# Patient Record
Sex: Female | Born: 1970 | Race: Black or African American | Hispanic: No | Smoking: Former smoker
Health system: Southern US, Community
[De-identification: ages and names within clinical notes are randomized; demographics above are authoritative.]

## PROBLEM LIST (undated history)

## (undated) DIAGNOSIS — G4730 Sleep apnea, unspecified: Secondary | ICD-10-CM

## (undated) DIAGNOSIS — K760 Fatty (change of) liver, not elsewhere classified: Secondary | ICD-10-CM

## (undated) DIAGNOSIS — F419 Anxiety disorder, unspecified: Secondary | ICD-10-CM

## (undated) DIAGNOSIS — F319 Bipolar disorder, unspecified: Secondary | ICD-10-CM

## (undated) DIAGNOSIS — F329 Major depressive disorder, single episode, unspecified: Secondary | ICD-10-CM

## (undated) DIAGNOSIS — E119 Type 2 diabetes mellitus without complications: Secondary | ICD-10-CM

## (undated) DIAGNOSIS — K219 Gastro-esophageal reflux disease without esophagitis: Secondary | ICD-10-CM

## (undated) DIAGNOSIS — J45909 Unspecified asthma, uncomplicated: Secondary | ICD-10-CM

## (undated) DIAGNOSIS — I10 Essential (primary) hypertension: Secondary | ICD-10-CM

## (undated) DIAGNOSIS — M1990 Unspecified osteoarthritis, unspecified site: Secondary | ICD-10-CM

## (undated) DIAGNOSIS — H269 Unspecified cataract: Secondary | ICD-10-CM

## (undated) DIAGNOSIS — F32A Depression, unspecified: Secondary | ICD-10-CM

## (undated) HISTORY — PX: CATARACT EXTRACTION: SUR2

## (undated) HISTORY — DX: Essential (primary) hypertension: I10

## (undated) HISTORY — PX: CHOLECYSTECTOMY: SHX55

## (undated) HISTORY — PX: CARPAL TUNNEL RELEASE: SHX101

## (undated) HISTORY — PX: TUBAL LIGATION: SHX77

## (undated) HISTORY — DX: Type 2 diabetes mellitus without complications: E11.9

## (undated) HISTORY — PX: HEMORROIDECTOMY: SUR656

## (undated) HISTORY — DX: Sleep apnea, unspecified: G47.30

## (undated) HISTORY — PX: HERNIA REPAIR: SHX51

---

## 2016-01-02 ENCOUNTER — Other Ambulatory Visit (HOSPITAL_COMMUNITY): Payer: Self-pay | Admitting: General Surgery

## 2016-01-22 ENCOUNTER — Ambulatory Visit (HOSPITAL_COMMUNITY)
Admission: RE | Admit: 2016-01-22 | Discharge: 2016-01-22 | Disposition: A | Discharge status: Home / Self Care | Payer: Medicare Other | Source: Ambulatory Visit | Attending: General Surgery | Admitting: General Surgery

## 2016-01-22 ENCOUNTER — Other Ambulatory Visit: Payer: Self-pay

## 2016-01-22 ENCOUNTER — Other Ambulatory Visit (HOSPITAL_COMMUNITY): Payer: Self-pay | Admitting: General Surgery

## 2016-01-22 DIAGNOSIS — Z01818 Encounter for other preprocedural examination: Secondary | ICD-10-CM | POA: Insufficient documentation

## 2016-01-22 DIAGNOSIS — Z6841 Body Mass Index (BMI) 40.0 and over, adult: Secondary | ICD-10-CM | POA: Diagnosis not present

## 2016-01-22 DIAGNOSIS — K219 Gastro-esophageal reflux disease without esophagitis: Secondary | ICD-10-CM | POA: Diagnosis not present

## 2016-01-29 ENCOUNTER — Ambulatory Visit (INDEPENDENT_AMBULATORY_CARE_PROVIDER_SITE_OTHER): Payer: Medicare Other | Admitting: Psychiatry

## 2016-02-11 ENCOUNTER — Ambulatory Visit: Payer: Medicare Other | Admitting: Psychiatry

## 2016-02-11 ENCOUNTER — Encounter: Payer: Self-pay | Admitting: Dietician

## 2016-02-11 ENCOUNTER — Encounter: Payer: Medicare Other | Attending: General Surgery | Admitting: Dietician

## 2016-02-11 DIAGNOSIS — Z6841 Body Mass Index (BMI) 40.0 and over, adult: Secondary | ICD-10-CM | POA: Insufficient documentation

## 2016-02-11 DIAGNOSIS — E119 Type 2 diabetes mellitus without complications: Secondary | ICD-10-CM | POA: Insufficient documentation

## 2016-02-11 NOTE — Progress Notes (Signed)
  Pre-Op Assessment Visit:  Pre-Operative Sleeve Gastrectomy Surgery  Medical Nutrition Therapy:  Appt start time: 1100   End time:  1130.  Patient was seen on 02/11/2016 for Pre-Operative Nutrition Assessment. Assessment and [facility] coordinator on 02/11/2016.   Preferred Learning Style:   No preference indicated   Learning Readiness:   Ready  Handouts given during visit include:  Pre-Op Goals Bariatric Surgery Protein Shakes   During the appointment today the following Pre-Op Goals were reviewed with the patient: Maintain or lose weight as instructed by your surgeon Make healthy food choices Begin to limit portion sizes Limited concentrated sugars and fried foods Keep fat/sugar in the single digits per serving on   food labels Practice CHEWING your food  (aim for 30 chews per bite or until applesauce consistency) Practice not drinking 15 minutes before, during, and 30 minutes after each meal/snack Avoid all carbonated beverages  Avoid/limit caffeinated beverages  Avoid all sugar-sweetened beverages Consume 3 meals per day; eat every 3-5 hours Make a list of non-food related activities Aim for 64-100 ounces of FLUID daily  Aim for at least 60-80 grams of PROTEIN daily Look for a liquid protein source that contain ?15 g protein and ?5 g carbohydrate  (ex: shakes, drinks, shots)  Patient-Centered Goals: Get off CPAP, decrease pain level, be able to walk without getting out of breath, get nicer clothes, feel better  10 confidence/10 importance scale   Demonstrated degree of understanding via:  Teach Back  Teaching Method Utilized:  Visual Auditory Hands on  Barriers to learning/adherence to lifestyle change: none  Patient to call the [facility] to enroll in Pre-Op and Post-Op Nutrition Education when surgery date is scheduled.

## 2016-02-11 NOTE — Patient Instructions (Signed)
Follow Pre-Op Goals Try Protein Shakes Call [facility] at [phone] when surgery is scheduled to enroll in Pre-Op Class  Things to remember:  Please always be honest with us. We want to support you!  If you have any questions or concerns in between appointments, please call or email Liz, Leslie, or Laurie.  The diet after surgery will be high protein and low in carbohydrate.  Vitamins and calcium need to be taken for the rest of your life.  Feel free to include support people in any classes or appointments.   Supplement recommendations:  Complete" Multivitamin: Sleeve Gastrectomy and RYGB patients take a double dose of MVI. LAGB patients take single dose as it is written on the package. Vitamin must be liquid or chewable but not gummy. Examples of these include Flintstones Complete and Centrum Complete. If the vitamin is bariatric-specific, take 1 dose as it is already formulated for bariatric surgery patients. Examples of these are Bariatric Advantage, Celebrate, and Wellesse. These can be found at the [facility] and/or online.     Calcium citrate: 1500 mg/day of Calcium citrate (also chewable or liquid) is recommended for all procedures. The body is only able to absorb 500-600 mg of Calcium at one time so 3 daily doses of 500 mg are recommended. Calcium doses must be taken a minimum of 2 hours apart. Additionally, Calcium must be taken 2 hours apart from iron-containing MVI. Examples of brands include Celebrate, Bariatric Advantage, and Wellesse. These brands must be purchased online or at the [facility]. Citracal Petites is the only Calcium citrate supplement found in general grocery stores and pharmacies. This is in tablet form and may be recommended for patients who do not tolerate chewable Calcium.  Continued or added Vitamin D supplementation based on individual needs.    Vitamin B12: 300-500 mcg/day for Sleeve Gastrectomy and RYGB. Optional for  LAGB patients as stomach remains fully intact. Must be taken intramuscularly, sublingually, or inhaled nasally. Oral route is not recommended. 

## 2016-02-12 ENCOUNTER — Ambulatory Visit: Payer: Medicare Other | Admitting: Psychiatry

## 2016-02-23 ENCOUNTER — Ambulatory Visit: Payer: Self-pay

## 2016-02-23 DIAGNOSIS — K760 Fatty (change of) liver, not elsewhere classified: Secondary | ICD-10-CM

## 2016-02-23 HISTORY — DX: Fatty (change of) liver, not elsewhere classified: K76.0

## 2016-02-27 ENCOUNTER — Ambulatory Visit: Payer: Self-pay | Admitting: General Surgery

## 2016-03-01 ENCOUNTER — Encounter: Payer: Medicare Other | Attending: General Surgery

## 2016-03-01 DIAGNOSIS — Z6841 Body Mass Index (BMI) 40.0 and over, adult: Secondary | ICD-10-CM | POA: Insufficient documentation

## 2016-03-01 DIAGNOSIS — E119 Type 2 diabetes mellitus without complications: Secondary | ICD-10-CM | POA: Insufficient documentation

## 2016-03-03 NOTE — Progress Notes (Signed)
  Pre-Operative Nutrition Class:  Appt start time: 1859   End time:  1830.  Patient was seen on 03/01/16 for Pre-Operative Bariatric Surgery Education at the [facility].   Surgery date: 03/16/16 Surgery type: Sleeve gastrectomy Start weight at [facility]: 313 lbs on 02/11/2016 Weight today: 307.1 lbs  TANITA  BODY COMP RESULTS  03/01/16   BMI (kg/m^2) N/A   Fat Mass (lbs)    Fat Free Mass (lbs)    Total Body Water (lbs)    Samples given per MNT protocol. Patient educated on appropriate usage: Premier protein shake (vanilla - qty 1) Lot #: [id number] Exp: 09/2016  Celebrate Vitamins Multivitamin (grape - qty 1) Lot #: [id number] Exp: 11/2016  Bariatric Advantage Calcium Citrate chew (caramel - qty 1) Lot #: [id number] Exp: 08/2016  Renee Pain Protein Powder (chocolate - qty 1) Lot #: [id number] Exp: 08/2017  The following the learning objectives were met by the patient during this course:  Identify Pre-Op Dietary Goals and will begin 2 weeks pre-operatively  Identify appropriate sources of fluids and proteins   State protein recommendations and appropriate sources pre and post-operatively  Identify Post-Operative Dietary Goals and will follow for 2 weeks post-operatively  Identify appropriate multivitamin and calcium sources  Describe the need for physical activity post-operatively and will follow MD recommendations  State when to call healthcare provider regarding medication questions or post-operative complications  Handouts given during class include:  Pre-Op Bariatric Surgery Diet Handout  Protein Shake Handout  Post-Op Bariatric Surgery Nutrition Handout  BELT Program Information Flyer  Support Group Information Flyer  [facility] Bariatric Supplements Price List  Follow-Up Plan: Patient will follow-up at [facility] 2 weeks post operatively for diet advancement per MD.

## 2016-03-12 ENCOUNTER — Encounter (HOSPITAL_COMMUNITY): Payer: Self-pay

## 2016-03-12 ENCOUNTER — Other Ambulatory Visit (HOSPITAL_COMMUNITY): Payer: Self-pay

## 2016-03-12 ENCOUNTER — Ambulatory Visit: Payer: Self-pay | Admitting: General Surgery

## 2016-03-12 ENCOUNTER — Encounter (HOSPITAL_COMMUNITY)
Admission: RE | Admit: 2016-03-12 | Discharge: 2016-03-12 | Disposition: A | Discharge status: Home / Self Care | Payer: Medicare Other | Source: Ambulatory Visit | Attending: General Surgery | Admitting: General Surgery

## 2016-03-12 DIAGNOSIS — Z01812 Encounter for preprocedural laboratory examination: Secondary | ICD-10-CM | POA: Diagnosis present

## 2016-03-12 DIAGNOSIS — Z6841 Body Mass Index (BMI) 40.0 and over, adult: Secondary | ICD-10-CM | POA: Diagnosis not present

## 2016-03-12 HISTORY — DX: Bipolar disorder, unspecified: F31.9

## 2016-03-12 HISTORY — DX: Anxiety disorder, unspecified: F41.9

## 2016-03-12 HISTORY — DX: Unspecified osteoarthritis, unspecified site: M19.90

## 2016-03-12 HISTORY — DX: Gastro-esophageal reflux disease without esophagitis: K21.9

## 2016-03-12 HISTORY — DX: Unspecified asthma, uncomplicated: J45.909

## 2016-03-12 HISTORY — DX: Depression, unspecified: F32.A

## 2016-03-12 HISTORY — DX: Major depressive disorder, single episode, unspecified: F32.9

## 2016-03-12 LAB — COMPREHENSIVE METABOLIC PANEL
ALBUMIN: 3.8 g/dL (ref 3.5–5.0)
ALT: 64 U/L — ABNORMAL HIGH (ref 14–54)
AST: 40 U/L (ref 15–41)
Alkaline Phosphatase: 64 U/L (ref 38–126)
Anion gap: 11 (ref 5–15)
BILIRUBIN TOTAL: 0.5 mg/dL (ref 0.3–1.2)
BUN: 13 mg/dL (ref 6–20)
CO2: 24 mmol/L (ref 22–32)
Calcium: 9.2 mg/dL (ref 8.9–10.3)
Chloride: 105 mmol/L (ref 101–111)
Creatinine, Ser: 0.87 mg/dL (ref 0.44–1.00)
GFR calc Af Amer: >60 mL/min (ref >60)
GFR calc non Af Amer: >60 mL/min (ref >60)
Glucose, Bld: 107 mg/dL — ABNORMAL HIGH (ref 65–99)
POTASSIUM: 3.9 mmol/L (ref 3.5–5.1)
Sodium: 140 mmol/L (ref 135–145)
TOTAL PROTEIN: 7.6 g/dL (ref 6.5–8.1)

## 2016-03-12 LAB — CBC WITH DIFFERENTIAL/PLATELET
BASOS ABS: 0 10*3/uL (ref 0.0–0.1)
BASOS PCT: 0 %
Eosinophils Absolute: 0.2 10*3/uL (ref 0.0–0.7)
Eosinophils Relative: 3 %
HEMATOCRIT: 38 % (ref 36.0–46.0)
HEMOGLOBIN: 12.5 g/dL (ref 12.0–15.0)
Lymphocytes Relative: 35 %
Lymphs Abs: 2.5 10*3/uL (ref 0.7–4.0)
MCH: 25.1 pg — ABNORMAL LOW (ref 26.0–34.0)
MCHC: 32.9 g/dL (ref 30.0–36.0)
MCV: 76.3 fL — ABNORMAL LOW (ref 78.0–100.0)
Monocytes Absolute: 0.4 10*3/uL (ref 0.1–1.0)
Monocytes Relative: 6 %
NEUTROS ABS: 3.9 10*3/uL (ref 1.7–7.7)
NEUTROS PCT: 56 %
Platelets: 419 10*3/uL — ABNORMAL HIGH (ref 150–400)
RBC: 4.98 MIL/uL (ref 3.87–5.11)
RDW: 15.4 % (ref 11.5–15.5)
WBC: 7.1 10*3/uL (ref 4.0–10.5)

## 2016-03-12 NOTE — H&P (Signed)
Gabriela Haney 03/12/2016 3:00 PM Location: [facility] Patient #: [id number] DOB: 05/16/1971 Single / Language: English / Race: Black or African American Female  History of Present Illness ([place] M. Jabreel Chimento MD; 03/12/2016 5:51 PM) The patient is a 44 year old female who presents for a pre-op visit. She comes in today for her preoperative visit she has been scheduled and approved for gastric sleeve. Surgery is May 23. I initially met her January 4. Her weight at that time was 306 pounds with a BMI of 48. She denies any medical changes since she was initially evaluated. She denies any trips to the emergency room or hospital. She denies any chest pain, chest pressure, shortness of breath, dyspnea on exertion, she denies smoking. She does have some reflux and takes reflux medication daily. She denies any issues with solid foods or tolerating liquids.  Her upper GI showed mild reflux otherwise no hiatal hernia or esophageal dysmotility. Her chest x-ray was within normal limits. She had had complete blood work at her primary care doctor's office a few days before her initial visit. 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 Gallbladder  Surgery - Laparoscopic Cataract Surgery Left.  Diagnostic Studies History (Lupie Sawa M Redina Zeller, MD; 03/12/2016 5:52 PM) Mammogram within last year Colonoscopy never Pap Smear 1-5 years ago  Allergies (Ajmal Kathan M Velma Agnes, MD; 03/12/2016 5:52 PM) No Known Drug Allergies 10/29/2015  Medication History (Demarrio Menges M Cailin Gebel, MD; 03/12/2016 5:52 PM) Omeprazole (40MG Capsule DR, Oral) Active. Etodolac (400MG Tablet, Oral) Active. Medications Reconciled OxyCODONE HCl (5MG/5ML Solution, 5-10 Milliliter Oral every four hours, as needed, Taken starting 03/12/2016) Active. Zofran (4MG Tablet, 1 (one) Tablet Oral every six hours, Taken starting 03/12/2016) Active. Loratadine (10MG Tablet, Oral) Active. Montelukast Sodium (10MG Tablet, Oral) Active. HydroCHLOROthiazide (25MG Tablet, Oral) Active. Vitamin D (Ergocalciferol) (50000UNIT Capsule, Oral) Active. Hydrocodone-Acetaminophen (10-325MG Tablet, Oral) Active. ALPRAZolam (0.5MG Tablet, Oral) Active. MetFORMIN HCl (500MG Tablet, Oral) Active. Fish Oil (1000MG Capsule, Oral) Active. Garcinia Cambogia XT Active.  Social History (Winnie Umali M Stassi Fadely, MD; 03/12/2016 5:52 PM) Tobacco use Former smoker. Illicit drug use Remotely quit drug use. Caffeine use Carbonated beverages, Coffee, Tea. Alcohol use Occasional alcohol use.  Family History (Maccoy Haubner M Tramell Piechota, MD; 03/12/2016 5:52 PM) Alcohol Abuse Family Members In General. Hypertension Family Members In General, Father, Mother, Sister. Diabetes Mellitus Family Members In General, Mother. Breast Cancer Family Members In General. Arthritis Family Members In General, Sister, Son. Depression Family Members In General. Cervical Cancer Sister.  Pregnancy / Birth History (Lundynn Cohoon M Kalep Full, MD; 03/12/2016 5:52 PM) Age at menarche 13 years. Maternal age <15 Gravida 4 Para 2 Regular periods     Review of Systems (Zilpha Mcandrew M. Flavius Repsher MD; 03/12/2016 5:48 PM) General Present- Night Sweats and  Weight Gain. Not Present- Appetite Loss, Chills, Fatigue, Fever and Weight Loss. Skin Not Present- Change in Wart/Mole, Dryness, Hives, Jaundice,   New Lesions, Non-Healing Wounds, Rash and Ulcer. HEENT Present- Wears glasses/contact lenses. Not Present- Earache, Hearing Loss, Hoarseness, Nose Bleed, Oral Ulcers, Ringing in the Ears, Seasonal Allergies, Sinus Pain, Sore Throat, Visual Disturbances and Yellow Eyes. Respiratory Present- Snoring. Not Present- Bloody sputum, Chronic Cough, Difficulty Breathing and Wheezing. Breast Not Present- Breast Mass, Breast Pain, Nipple Discharge and Skin Changes. Cardiovascular Present- Shortness of Breath. Not Present- Chest Pain, Difficulty Breathing Lying Down, Leg Cramps, Palpitations, Rapid Heart Rate and Swelling of Extremities. Gastrointestinal Not Present- Abdominal Pain, Bloating, Bloody Stool, Change in Bowel Habits, Chronic diarrhea, Constipation, Difficulty Swallowing, Excessive gas, Gets full quickly at meals, Hemorrhoids, Indigestion, Nausea, Rectal Pain and Vomiting. Female Genitourinary Present- Pelvic Pain. Not Present- Frequency, Nocturia, Painful Urination and Urgency. Musculoskeletal Present- Back Pain, Joint Pain and Joint Stiffness. Not Present- Muscle Pain, Muscle Weakness and Swelling of Extremities. Neurological Present- Trouble walking. Not Present- Decreased Memory, Fainting, Headaches, Numbness, Seizures, Tingling, Tremor and Weakness. Psychiatric Present- Anxiety and Bipolar. Not Present- Change in Sleep Pattern, Depression, Fearful and Frequent crying. Endocrine Present- Excessive Hunger and Hot flashes. Not Present- Cold Intolerance, Hair Changes, Heat Intolerance and New Diabetes. Hematology Not Present- Easy Bruising, Excessive bleeding, Gland problems, HIV and Persistent Infections.  Vitals (Alisha Spillers CMA; 03/12/2016 3:01 PM) 03/12/2016 3:00 PM Weight: 304 lb Height: 66.75in Body Surface Area: 2.41 m Body Mass Index:  47.97 kg/m  Pulse: 119 (Regular)  BP: 138/82 (Sitting, Left Arm, Standard)      Physical Exam Randall Hiss M. Raphael Fitzpatrick MD; 03/12/2016 5:48 PM)  General Mental Status-Alert. General Appearance-Consistent with stated age. Hydration-Well hydrated. Voice-Normal. Note: morbidly obese, mainly central truncal  Head and Neck Head-normocephalic, atraumatic with no lesions or palpable masses. Trachea-midline. Thyroid Gland Characteristics - normal size and consistency.  Eye Eyeball - Bilateral-Extraocular movements intact. Sclera/Conjunctiva - Bilateral-No scleral icterus.  Chest and Lung Exam Chest and lung exam reveals -quiet, even and easy respiratory effort with no use of accessory muscles and on auscultation, normal breath sounds, no adventitious sounds and normal vocal resonance. Inspection Chest Wall - Normal. Back - normal.  Breast - Did not examine.  Cardiovascular Cardiovascular examination reveals -normal heart sounds, regular rate and rhythm with no murmurs and normal pedal pulses bilaterally.  Abdomen Inspection Inspection of the abdomen reveals - No Hernias. Skin - Scar - Note: small umbilical incision; old trocar scars. Palpation/Percussion Palpation and Percussion of the abdomen reveal - Soft, Non Tender, No Rebound tenderness, No Rigidity (guarding) and No hepatosplenomegaly. Auscultation Auscultation of the abdomen reveals - Bowel sounds normal.  Peripheral Vascular Upper Extremity Palpation - Pulses bilaterally normal.  Neurologic Neurologic evaluation reveals -alert and oriented x 3 with no impairment of recent or remote memory. Mental Status-Normal.  Neuropsychiatric The patient's mood and affect are described as -normal. Judgment and Insight-insight is appropriate concerning matters relevant to self.  Musculoskeletal Normal Exam - Left-Upper Extremity Strength Normal and Lower Extremity Strength Normal. Normal Exam -  Right-Upper Extremity Strength Normal and Lower Extremity Strength Normal. Note: bilateral knee crepitus  Lymphatic Head & Neck  General Head & Neck Lymphatics: Bilateral - Description - Normal. Axillary - Did not examine. Femoral & Inguinal - Did not examine.    Assessment & Plan Randall Hiss M. Amiyrah Lamere MD; 03/12/2016 5:52 PM)  MORBID OBESITY WITH BMI OF 45.0-49.9, ADULT (E66.01) Impression: We discussed her preoperative workup. We reviewed the findings of her upper GI and discussed her blood work done and her primary care physician's office. I believe she is still safe  for sleeve gastrectomy. She was given her postoperative pain and nausea prescriptions today. We discussed the preoperative diet. We rediscussed a typical postoperative recovery course as well as the typical issue with nausea and epigastric discomfort immediately after surgery. All of her questions were asked and answered.  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 Debra Calabretta, MD, FACS General, Bariatric, & Minimally Invasive Surgery [facility]  

## 2016-03-12 NOTE — Patient Instructions (Addendum)
Gabriela Haney  03/12/2016   Your procedure is scheduled on: 03/16/16  Report to [facility]  Entrance take [facility]  elevators to 3rd floor to  Short Stay Center at 5:30 am  Call this number if you have problems the morning of surgery [phone]   Remember: ONLY 1 PERSON MAY GO WITH YOU TO SHORT STAY TO GET  READY MORNING OF YOUR SURGERY.  Do not eat food or drink liquids :After Midnight Monday     Take these medicines the morning of surgery with A SIP OF WATER: Xanax, Claritin, Singulair, Omeprazole            DO NOT TAKE ANY DIABETIC MEDICATIONS DAY OF YOUR SURGERY                               You may not have any metal on your body including hair pins and              piercings  Do not wear jewelry, make-up, lotions, powders or perfumes, deodorant             Do not wear nail polish.  Do not shave  48 hours prior to surgery.                 Do not bring valuables to the hospital. [facility] IS NOT             RESPONSIBLE   FOR VALUABLES.  Contacts, dentures or bridgework may not be worn into surgery.  Leave suitcase in the car. After surgery it may be brought to your room.      _____________________________________________________________________             [facility] - Preparing for Surgery  Before surgery, you can play an important role.  Because skin is not sterile, your skin needs to be as free of germs as possible.  You can reduce the number of germs on you skin by washing with CHG (chlorahexidine gluconate) soap before surgery.  CHG is an antiseptic cleaner which kills germs and bonds with the skin to continue killing germs even after washing.  Please DO NOT use if you have an allergy to CHG or antibacterial soaps.  If your skin becomes reddened/irritated stop using the CHG and inform your nurse when you arrive at Short Stay.  Do not shave (including legs and underarms) for at least 48 hours prior to the first CHG shower.  You may shave  your face.  Please follow these instructions carefully:   1.  Shower with CHG Soap the night before surgery and the                                morning of Surgery.  2.  If you choose to wash your hair, wash your hair first as usual with your       normal shampoo.  3.  After you shampoo, rinse your hair and body thoroughly to remove the                      Shampoo.  4.  Use CHG as you would any other liquid soap.  You can apply chg directly       to the skin and wash  gently with scrungie or a clean washcloth.  5.  Apply the CHG Soap to your body ONLY FROM THE NECK DOWN.        Do not use on open wounds or open sores.  Avoid contact with your eyes,       ears, mouth and genitals (private parts).  Wash genitals (private parts)       with your normal soap.  6.  Wash thoroughly, paying special attention to the area where your surgery        will be performed.  7.  Thoroughly rinse your body with warm water from the neck down.  8.  DO NOT shower/wash with your normal soap after using and rinsing off       the CHG Soap.  9.  Pat yourself dry with a clean towel.            10.  Wear clean pajamas.            11.  Place clean sheets on your bed the night of your first shower and do not        sleep with pets.  Day of Surgery  Do not apply any lotions/deoderants the morning of surgery.  Please wear clean clothes to the hospital/surgery center.  [facility] - Preparing for Surgery  Before surgery, you can play an important role.  Because skin is not sterile, your skin needs to be as free of germs as possible.  You can reduce the number of germs on you skin by washing with CHG (chlorahexidine gluconate) soap before surgery.  CHG is an antiseptic cleaner which kills germs and bonds with the skin to continue killing germs even after washing.  Please DO NOT use if you have an allergy to CHG or antibacterial soaps.  If your skin becomes reddened/irritated stop using the CHG and inform your nurse when you  arrive at Short Stay.  Do not shave (including legs and underarms) for at least 48 hours prior to the first CHG shower.  You may shave your face.  Please follow these instructions carefully:   1.  Shower with CHG Soap the night before surgery and the                                morning of Surgery.  2.  If you choose to wash your hair, wash your hair first as usual with your       normal shampoo.  3.  After you shampoo, rinse your hair and body thoroughly to remove the                      Shampoo.  4.  Use CHG as you would any other liquid soap.  You can apply chg directly       to the skin and wash gently with scrungie or a clean washcloth.  5.  Apply the CHG Soap to your body ONLY FROM THE NECK DOWN.        Do not use on open wounds or open sores.  Avoid contact with your eyes,       ears, mouth and genitals (private parts).  Wash genitals (private parts)       with your normal soap.  6.  Wash thoroughly, paying special attention to the area where your surgery        will be performed.  7.  Thoroughly  rinse your body with warm water from the neck down.  8.  DO NOT shower/wash with your normal soap after using and rinsing off       the CHG Soap.  9.  Pat yourself dry with a clean towel.            10.  Wear clean pajamas.            11.  Place clean sheets on your bed the night of your first shower and do not        sleep with pets.  Day of Surgery  Do not apply any lotions/deodorants the morning of surgery.  Please wear clean clothes to the hospital/surgery center.

## 2016-03-12 NOTE — Pre-Procedure Instructions (Signed)
EKG in EPIC 

## 2016-03-12 NOTE — Pre-Procedure Instructions (Signed)
Lab results routed to Dr. Gaynelle AduEric [name].

## 2016-03-16 ENCOUNTER — Inpatient Hospital Stay (HOSPITAL_COMMUNITY): Payer: Medicare Other | Admitting: Registered Nurse

## 2016-03-16 ENCOUNTER — Encounter (HOSPITAL_COMMUNITY)
Admission: RE | Disposition: A | Discharge status: Home / Self Care | Payer: Self-pay | Source: Ambulatory Visit | Attending: General Surgery

## 2016-03-16 ENCOUNTER — Ambulatory Visit (HOSPITAL_COMMUNITY)
Admission: RE | Admit: 2016-03-16 | Discharge: 2016-03-16 | Disposition: A | Discharge status: Home / Self Care | Payer: Medicare Other | Source: Ambulatory Visit | Attending: General Surgery | Admitting: General Surgery

## 2016-03-16 ENCOUNTER — Encounter (HOSPITAL_COMMUNITY): Payer: Self-pay | Admitting: *Deleted

## 2016-03-16 DIAGNOSIS — M545 Low back pain: Secondary | ICD-10-CM | POA: Insufficient documentation

## 2016-03-16 DIAGNOSIS — F319 Bipolar disorder, unspecified: Secondary | ICD-10-CM | POA: Diagnosis not present

## 2016-03-16 DIAGNOSIS — F419 Anxiety disorder, unspecified: Secondary | ICD-10-CM | POA: Diagnosis not present

## 2016-03-16 DIAGNOSIS — E785 Hyperlipidemia, unspecified: Secondary | ICD-10-CM | POA: Diagnosis not present

## 2016-03-16 DIAGNOSIS — Z7984 Long term (current) use of oral hypoglycemic drugs: Secondary | ICD-10-CM | POA: Diagnosis not present

## 2016-03-16 DIAGNOSIS — G8929 Other chronic pain: Secondary | ICD-10-CM | POA: Insufficient documentation

## 2016-03-16 DIAGNOSIS — M170 Bilateral primary osteoarthritis of knee: Secondary | ICD-10-CM | POA: Insufficient documentation

## 2016-03-16 DIAGNOSIS — E119 Type 2 diabetes mellitus without complications: Secondary | ICD-10-CM | POA: Diagnosis not present

## 2016-03-16 DIAGNOSIS — G4733 Obstructive sleep apnea (adult) (pediatric): Secondary | ICD-10-CM | POA: Diagnosis not present

## 2016-03-16 DIAGNOSIS — Z6841 Body Mass Index (BMI) 40.0 and over, adult: Secondary | ICD-10-CM | POA: Insufficient documentation

## 2016-03-16 DIAGNOSIS — K760 Fatty (change of) liver, not elsewhere classified: Secondary | ICD-10-CM | POA: Diagnosis not present

## 2016-03-16 DIAGNOSIS — Z87891 Personal history of nicotine dependence: Secondary | ICD-10-CM | POA: Diagnosis not present

## 2016-03-16 DIAGNOSIS — K660 Peritoneal adhesions (postprocedural) (postinfection): Secondary | ICD-10-CM | POA: Insufficient documentation

## 2016-03-16 DIAGNOSIS — Z79899 Other long term (current) drug therapy: Secondary | ICD-10-CM | POA: Diagnosis not present

## 2016-03-16 DIAGNOSIS — I10 Essential (primary) hypertension: Secondary | ICD-10-CM | POA: Insufficient documentation

## 2016-03-16 HISTORY — PX: LAPAROSCOPIC GASTRIC SLEEVE RESECTION: SHX5895

## 2016-03-16 LAB — GLUCOSE, CAPILLARY
GLUCOSE-CAPILLARY: 131 mg/dL — AB (ref 65–99)
Glucose-Capillary: 165 mg/dL — ABNORMAL HIGH (ref 65–99)

## 2016-03-16 LAB — PREGNANCY, URINE: PREG TEST UR: NEGATIVE

## 2016-03-16 SURGERY — GASTRECTOMY, SLEEVE, LAPAROSCOPIC
Anesthesia: General | Site: Abdomen

## 2016-03-16 MED ORDER — BUPIVACAINE LIPOSOME 1.3 % IJ SUSP
INTRAMUSCULAR | Status: DC | PRN
  Administered 2016-03-16: 20 mL

## 2016-03-16 MED ORDER — HYDROCODONE-ACETAMINOPHEN 5-325 MG PO TABS: 1.0000 | ORAL_TABLET | Freq: Four times a day (QID) | ORAL | Status: DC | PRN

## 2016-03-16 MED ORDER — PROPOFOL 10 MG/ML IV BOLUS
INTRAVENOUS | Status: AC
  Filled 2016-03-16: qty 40

## 2016-03-16 MED ORDER — EPHEDRINE SULFATE 50 MG/ML IJ SOLN
INTRAMUSCULAR | Status: AC
  Filled 2016-03-16: qty 1

## 2016-03-16 MED ORDER — MEPERIDINE HCL 50 MG/ML IJ SOLN: 6.2500 mg | INTRAMUSCULAR | Status: DC | PRN

## 2016-03-16 MED ORDER — FENTANYL CITRATE (PF) 100 MCG/2ML IJ SOLN
INTRAMUSCULAR | Status: DC | PRN
  Administered 2016-03-16: 150 ug via INTRAVENOUS
  Administered 2016-03-16 (×2): 50 ug via INTRAVENOUS

## 2016-03-16 MED ORDER — FENTANYL CITRATE (PF) 250 MCG/5ML IJ SOLN
INTRAMUSCULAR | Status: AC
  Filled 2016-03-16: qty 5

## 2016-03-16 MED ORDER — KETOROLAC TROMETHAMINE 30 MG/ML IJ SOLN
INTRAMUSCULAR | Status: AC
  Filled 2016-03-16: qty 1

## 2016-03-16 MED ORDER — LABETALOL HCL 5 MG/ML IV SOLN
INTRAVENOUS | Status: DC | PRN
  Administered 2016-03-16 (×2): 2.5 mg via INTRAVENOUS

## 2016-03-16 MED ORDER — SUCCINYLCHOLINE CHLORIDE 20 MG/ML IJ SOLN
INTRAMUSCULAR | Status: DC | PRN
  Administered 2016-03-16: 100 mg via INTRAVENOUS

## 2016-03-16 MED ORDER — ATROPINE SULFATE 0.4 MG/ML IJ SOLN
INTRAMUSCULAR | Status: AC
  Filled 2016-03-16: qty 2

## 2016-03-16 MED ORDER — SODIUM CHLORIDE 0.9 % IJ SOLN
INTRAMUSCULAR | Status: AC
  Filled 2016-03-16: qty 50

## 2016-03-16 MED ORDER — DEXAMETHASONE SODIUM PHOSPHATE 10 MG/ML IJ SOLN
INTRAMUSCULAR | Status: AC
  Filled 2016-03-16: qty 1

## 2016-03-16 MED ORDER — PROPOFOL 10 MG/ML IV BOLUS
INTRAVENOUS | Status: DC | PRN
  Administered 2016-03-16: 170 mg via INTRAVENOUS

## 2016-03-16 MED ORDER — SODIUM CHLORIDE 0.9 % IJ SOLN
INTRAMUSCULAR | Status: AC
  Filled 2016-03-16: qty 10

## 2016-03-16 MED ORDER — ONDANSETRON HCL 4 MG/2ML IJ SOLN
INTRAMUSCULAR | Status: AC
  Filled 2016-03-16: qty 2

## 2016-03-16 MED ORDER — HYDROMORPHONE HCL 1 MG/ML IJ SOLN
INTRAMUSCULAR | Status: DC | PRN
  Administered 2016-03-16: 0.5 mg via INTRAVENOUS

## 2016-03-16 MED ORDER — ROCURONIUM BROMIDE 50 MG/5ML IV SOLN
INTRAVENOUS | Status: AC
  Filled 2016-03-16: qty 1

## 2016-03-16 MED ORDER — HYDROMORPHONE HCL 1 MG/ML IJ SOLN: 0.2500 mg | INTRAMUSCULAR | Status: DC | PRN

## 2016-03-16 MED ORDER — LIDOCAINE HCL (CARDIAC) 20 MG/ML IV SOLN
INTRAVENOUS | Status: AC
  Filled 2016-03-16: qty 5

## 2016-03-16 MED ORDER — LABETALOL HCL 5 MG/ML IV SOLN
INTRAVENOUS | Status: AC
  Filled 2016-03-16: qty 4

## 2016-03-16 MED ORDER — KETOROLAC TROMETHAMINE 30 MG/ML IJ SOLN
INTRAMUSCULAR | Status: DC | PRN
  Administered 2016-03-16: 30 mg via INTRAVENOUS

## 2016-03-16 MED ORDER — SODIUM CHLORIDE 0.9 % IJ SOLN
INTRAMUSCULAR | Status: DC | PRN
  Administered 2016-03-16: 50 mL via INTRAVENOUS

## 2016-03-16 MED ORDER — LACTATED RINGERS IR SOLN
Status: DC | PRN
  Administered 2016-03-16: 1000 mL

## 2016-03-16 MED ORDER — HEPARIN SODIUM (PORCINE) 5000 UNIT/ML IJ SOLN
5000.0000 [IU] | INTRAMUSCULAR | Status: AC
  Administered 2016-03-16: 5000 [IU] via SUBCUTANEOUS
  Filled 2016-03-16: qty 1

## 2016-03-16 MED ORDER — BUPIVACAINE LIPOSOME 1.3 % IJ SUSP
20.0000 mL | Freq: Once | INTRAMUSCULAR | Status: DC
  Filled 2016-03-16: qty 20

## 2016-03-16 MED ORDER — MIDAZOLAM HCL 2 MG/2ML IJ SOLN
INTRAMUSCULAR | Status: AC
  Filled 2016-03-16: qty 2

## 2016-03-16 MED ORDER — ROCURONIUM BROMIDE 100 MG/10ML IV SOLN
INTRAVENOUS | Status: DC | PRN
  Administered 2016-03-16: 30 mg via INTRAVENOUS
  Administered 2016-03-16: 20 mg via INTRAVENOUS

## 2016-03-16 MED ORDER — DEXAMETHASONE SODIUM PHOSPHATE 10 MG/ML IJ SOLN
INTRAMUSCULAR | Status: DC | PRN
  Administered 2016-03-16: 10 mg via INTRAVENOUS

## 2016-03-16 MED ORDER — SUGAMMADEX SODIUM 500 MG/5ML IV SOLN
INTRAVENOUS | Status: DC | PRN
  Administered 2016-03-16: 300 mg via INTRAVENOUS

## 2016-03-16 MED ORDER — ONDANSETRON HCL 4 MG/2ML IJ SOLN
INTRAMUSCULAR | Status: DC | PRN
  Administered 2016-03-16: 4 mg via INTRAVENOUS

## 2016-03-16 MED ORDER — SUGAMMADEX SODIUM 500 MG/5ML IV SOLN
INTRAVENOUS | Status: AC
  Filled 2016-03-16: qty 5

## 2016-03-16 MED ORDER — MIDAZOLAM HCL 5 MG/5ML IJ SOLN
INTRAMUSCULAR | Status: DC | PRN
  Administered 2016-03-16 (×2): 1 mg via INTRAVENOUS

## 2016-03-16 MED ORDER — ONDANSETRON HCL 4 MG/2ML IJ SOLN: 4.0000 mg | Freq: Once | INTRAMUSCULAR | Status: DC | PRN

## 2016-03-16 MED ORDER — HYDROMORPHONE HCL 2 MG/ML IJ SOLN
INTRAMUSCULAR | Status: AC
  Filled 2016-03-16: qty 1

## 2016-03-16 MED ORDER — CEFOTETAN DISODIUM-DEXTROSE 2-2.08 GM-% IV SOLR
2.0000 g | INTRAVENOUS | Status: AC
  Administered 2016-03-16: 2 g via INTRAVENOUS

## 2016-03-16 MED ORDER — LACTATED RINGERS IV SOLN
INTRAVENOUS | Status: DC | PRN
  Administered 2016-03-16: 07:00:00 via INTRAVENOUS

## 2016-03-16 MED ORDER — CEFOTETAN DISODIUM-DEXTROSE 2-2.08 GM-% IV SOLR
INTRAVENOUS | Status: AC
  Filled 2016-03-16: qty 50

## 2016-03-16 SURGICAL SUPPLY — 61 items
APPLICATOR COTTON TIP 6IN STRL (MISCELLANEOUS) IMPLANT
APPLIER CLIP ROT 10 11.4 M/L (STAPLE)
BLADE SURG SZ11 CARB STEEL (BLADE) ×3 IMPLANT
CABLE HIGH FREQUENCY MONO STRZ (ELECTRODE) ×3 IMPLANT
CHLORAPREP W/TINT 26ML (MISCELLANEOUS) ×6 IMPLANT
CLIP APPLIE ROT 10 11.4 M/L (STAPLE) IMPLANT
COVER SURGICAL LIGHT HANDLE (MISCELLANEOUS) IMPLANT
DERMABOND ADVANCED (GAUZE/BANDAGES/DRESSINGS) ×2
DERMABOND ADVANCED .7 DNX12 (GAUZE/BANDAGES/DRESSINGS) ×1 IMPLANT
DEVICE SUT QUICK LOAD TK 5 (STAPLE) IMPLANT
DEVICE SUT TI-KNOT TK 5X26 (MISCELLANEOUS) IMPLANT
DEVICE SUTURE ENDOST 10MM (ENDOMECHANICALS) IMPLANT
DEVICE TI KNOT TK5 (MISCELLANEOUS)
DEVICE TROCAR PUNCTURE CLOSURE (ENDOMECHANICALS) IMPLANT
DRAPE UTILITY XL STRL (DRAPES) ×6 IMPLANT
ELECT REM PT RETURN 9FT ADLT (ELECTROSURGICAL) ×3
ELECTRODE REM PT RTRN 9FT ADLT (ELECTROSURGICAL) ×1 IMPLANT
GAUZE SPONGE 4X4 12PLY STRL (GAUZE/BANDAGES/DRESSINGS) IMPLANT
GLOVE BIO SURGEON STRL SZ7.5 (GLOVE) ×3 IMPLANT
GLOVE INDICATOR 8.0 STRL GRN (GLOVE) ×3 IMPLANT
GOWN STRL REUS W/TWL XL LVL3 (GOWN DISPOSABLE) ×15 IMPLANT
HOVERMATT SINGLE USE (MISCELLANEOUS) ×3 IMPLANT
KIT BASIN OR (CUSTOM PROCEDURE TRAY) ×3 IMPLANT
MARKER SKIN DUAL TIP RULER LAB (MISCELLANEOUS) ×3 IMPLANT
NEEDLE SPNL 22GX3.5 QUINCKE BK (NEEDLE) ×3 IMPLANT
PACK UNIVERSAL I (CUSTOM PROCEDURE TRAY) ×3 IMPLANT
QUICK LOAD TK 5 (STAPLE)
RELOAD STAPLER BLUE 60MM (STAPLE) IMPLANT
RELOAD STAPLER GOLD 60MM (STAPLE) IMPLANT
RELOAD STAPLER GREEN 60MM (STAPLE) IMPLANT
SCISSORS LAP 5X45 EPIX DISP (ENDOMECHANICALS) ×3 IMPLANT
SEALANT SURGICAL APPL DUAL CAN (MISCELLANEOUS) IMPLANT
SET IRRIG TUBING LAPAROSCOPIC (IRRIGATION / IRRIGATOR) ×3 IMPLANT
SHEARS HARMONIC ACE PLUS 45CM (MISCELLANEOUS) ×3 IMPLANT
SLEEVE ADV FIXATION 5X100MM (TROCAR) ×6 IMPLANT
SLEEVE GASTRECTOMY 40FR VISIGI (MISCELLANEOUS) ×3 IMPLANT
SLEEVE XCEL OPT CAN 5 100 (ENDOMECHANICALS) IMPLANT
SOLUTION ANTI FOG 6CC (MISCELLANEOUS) ×3 IMPLANT
SPONGE LAP 18X18 X RAY DECT (DISPOSABLE) ×3 IMPLANT
STAPLER ECHELON BIOABSB 60 FLE (MISCELLANEOUS) ×6 IMPLANT
STAPLER ECHELON LONG 60 440 (INSTRUMENTS) ×3 IMPLANT
STAPLER RELOAD BLUE 60MM (STAPLE)
STAPLER RELOAD GOLD 60MM (STAPLE)
STAPLER RELOAD GREEN 60MM (STAPLE)
SUT MNCRL AB 4-0 PS2 18 (SUTURE) ×3 IMPLANT
SUT SURGIDAC NAB ES-9 0 48 120 (SUTURE) IMPLANT
SUT VICRYL 0 TIES 12 18 (SUTURE) ×3 IMPLANT
SYR 10ML ECCENTRIC (SYRINGE) ×3 IMPLANT
SYR 20CC LL (SYRINGE) ×3 IMPLANT
SYR 50ML LL SCALE MARK (SYRINGE) ×3 IMPLANT
TOWEL OR 17X26 10 PK STRL BLUE (TOWEL DISPOSABLE) ×3 IMPLANT
TOWEL OR NON WOVEN STRL DISP B (DISPOSABLE) ×3 IMPLANT
TRAY FOLEY W/METER SILVER 14FR (SET/KITS/TRAYS/PACK) IMPLANT
TRAY FOLEY W/METER SILVER 16FR (SET/KITS/TRAYS/PACK) IMPLANT
TROCAR ADV FIXATION 5X100MM (TROCAR) ×3 IMPLANT
TROCAR BLADELESS 15MM (ENDOMECHANICALS) ×3 IMPLANT
TROCAR BLADELESS OPT 5 100 (ENDOMECHANICALS) ×3 IMPLANT
TUBING CONNECTING 10 (TUBING) ×2 IMPLANT
TUBING CONNECTING 10' (TUBING) ×1
TUBING ENDO SMARTCAP (MISCELLANEOUS) ×3 IMPLANT
TUBING INSUF HEATED (TUBING) ×3 IMPLANT

## 2016-03-16 NOTE — Op Note (Signed)
03/16/2016  8:51 AM  PATIENT:  Gabriela Haney  45 y.o. female  PRE-OPERATIVE DIAGNOSIS:  Morbid Obesity  POST-OPERATIVE DIAGNOSIS:  Morbid Obesity, intra-abdominal adhesions, fatty liver  PROCEDURE:  Procedure(s): DIAGNOSTIC LAPAROSCOPY,  LYSIS OF ADHESIONS 30 min  SURGEON:  Surgeon(s): Gaynelle Adu, MD Luretha Murphy, MD  ASSISTANTS: see above   ANESTHESIA:   general  DRAINS: none   LOCAL MEDICATIONS USED:  OTHER exparel  SPECIMEN:  No Specimen  DISPOSITION OF SPECIMEN:  N/A  COUNTS:  YES  INDICATION FOR PROCEDURE: Patient is a pleasant 45 year old morbidly obese African-American female who presented today for laparoscopic sleeve gastrectomy. She had a prior umbilical hernia repair. We have discussed at length the risk and benefits of surgery. Please see H&P for further details regarding that discussion  PROCEDURE: After obtaining informed consent and receiving 5000 units of subcutaneous heparin the patient was brought to the operating room at [facility] and placed supine on the operating room table. General endotracheal anesthesia was established. Sequential compression devices were placed. Her abdomen was prepped and draped in the usual standard surgical fashion with ChloraPrep. She received IV antibiotics prior to skin incision. A surgical timeout was performed. I elected to gain access to the abdomen using the Optiview technique in the lateral left upper quadrant below the subcostal margin. A small half centimeter incision was made then using a 0 5 mm laparoscope through a 5 mm trocar I advanced it under direct visualization into the abdominal cavity. Pneumoperitoneum was smoothly established up to a patient pressure of 15 mmHg without any change in patient vital signs. There are omental adhesions to her mid abdomen mainly starting at the level of umbilicus going all the way up to her liver. Her liver appeared quite fatty. An additional 5 mm trocar was placed in the lateral left  mid abdominal wall under direct visualization. Then using the harmonic scalpel I took down the omental adhesions. There is no evidence of bowel adhered to the abdominal wall. Her mesh was intact. There is evidence of metal tacks as well. The mesh extended all the way up past her falciform ligament. All of the omental attachments were taken down. There is no evidence of bowel nearby. I then made a small incision in the epigastrium and attempted to place the liver retractor. Her left lobe was quite bulky and very hypertrophied medially. There was not a lot of working room. Her stomach and liver were above her rib cage. Before lifting up the liver her stomach was not able be visualized due to the extreme size of the liver. Upon trying to lift up the left lobe of the liver with the [facility] liver retractor, this revealed a very small working area. We could barely identify her GE junction. We tried a smaller Hotel manager and again the optics were not any better. After discussion with my Designer, television/film set we both felt it was not safe to proceed with surgery today. Her liver was simply too large and fatty. The [facility] liver retractor was removed under direct visualization. Exparel was infiltrated around the trocar sites. There is no sign of ongoing bleeding from the omentum which had been lysed. Pneumoperitoneum was released and the trochars removed. Skin incisions were closed with a 4-0 Monocryl in a subcuticular fashion followed by Dermabond. The patient was extubated and brought to the recovery room in stable condition. All needle, instrument, and sponge counts were correct 2.  The patient will be placed on our preoperative diet plan again  will have to lose 20-30 pounds prior to coming back to the operating room for another attempt for sleeve gastrectomy  PLAN OF CARE: Discharge to home after PACU  PATIENT DISPOSITION:  PACU - hemodynamically stable.   Delay start of Pharmacological VTE agent  (>24hrs) due to surgical blood loss or risk of bleeding:  not applicable  Mary SellaEric M. Andrey CampanileWilson, MD, FACS General, Bariatric, & Minimally Invasive Surgery [facility], [place]

## 2016-03-16 NOTE — Anesthesia Procedure Notes (Addendum)
Procedure Name: Intubation Date/Time: 03/16/2016 7:23 AM Performed by: Jarvis NewcomerARMISTEAD, Maximo Spratling A Pre-anesthesia Checklist: Patient identified Patient Re-evaluated:Patient Re-evaluated prior to inductionOxygen Delivery Method: Simple face mask Preoxygenation: Pre-oxygenation with 100% oxygen Intubation Type: IV induction Ventilation: Mask ventilation without difficulty Laryngoscope Size: Mac and 4 Grade View: Grade II Tube type: Oral Tube size: 7.5 mm Number of attempts: 1 Airway Equipment and Method: Stylet Placement Confirmation: ETT inserted through vocal cords under direct vision,  positive ETCO2 and breath sounds checked- equal and bilateral Secured at: 21 cm Tube secured with: Tape Comments: Intubation by B. Comer, SRNA

## 2016-03-16 NOTE — Transfer of Care (Signed)
Immediate Anesthesia Transfer of Care Note  Patient: Gabriela LanesKisha D Futch  Procedure(s) Performed: Procedure(s): DIAGNOSTIC LAPAROSCOPY LYSIS OF ADHESIONS (N/A)  Patient Location: PACU  Anesthesia Type:General  Level of Consciousness: awake, alert , oriented and patient cooperative  Airway & Oxygen Therapy: Patient Spontanous Breathing and Patient connected to face mask oxygen  Post-op Assessment: Report given to RN, Post -op Vital signs reviewed and stable and Patient moving all extremities  Post vital signs: Reviewed and stable  Last Vitals:  Filed Vitals:   03/16/16 0610  BP: 148/82  Pulse: 110  Temp: 36.7 C  Resp: 18    Last Pain: There were no vitals filed for this visit.       Complications: No apparent anesthesia complications

## 2016-03-16 NOTE — Discharge Instructions (Signed)
[facility]  ONLY GET THE [facility] PAIN PRESCRIPTION FILLED IF YOU RUN OUT OF YOUR HOME SUPPLY YOU ALREADY HAVE  LAPAROSCOPIC SURGERY: POST OP INSTRUCTIONS Always review your discharge instruction sheet given to you by the facility where your surgery was performed. IF YOU HAVE DISABILITY OR FAMILY LEAVE FORMS, YOU MUST BRING THEM TO THE OFFICE FOR PROCESSING.   DO NOT GIVE THEM TO YOUR DOCTOR.  1. A prescription for pain medication may be given to you upon discharge.  Take your pain medication as prescribed, if needed.  If narcotic pain medicine is not needed, then you may take acetaminophen (Tylenol) or ibuprofen (Advil) as needed. 2. Take your usually prescribed medications unless otherwise directed. 3. If you need a refill on your pain medication, please contact your pharmacy.  They will contact our office to request authorization. Prescriptions will not be filled after 5pm or on week-ends. 4. You should follow a light diet the first few days after arrival home, such as soup and crackers, etc.  Be sure to include lots of fluids daily. 5. Most patients will experience some swelling and bruising in the area of the incisions.  Ice packs will help.  Swelling and bruising can take several days to resolve.  6. It is common to experience some constipation if taking pain medication after surgery.  Increasing fluid intake and taking a stool softener (such as Colace) will usually help or prevent this problem from occurring.  A mild laxative (Milk of Magnesia or Miralax) should be taken according to package instructions if there are no bowel movements after 48 hours. 7.  If your surgeon used skin glue on the incision, you may shower in 24 hours.  The glue will flake off over the next 2-3 weeks.  Any sutures or staples will be removed at the office during your follow-up visit. 8. ACTIVITIES:  You may resume regular (light) daily activities beginning the next day--such as daily self-care,  walking, climbing stairs--gradually increasing activities as tolerated.  You may have sexual intercourse when it is comfortable.  Refrain from any heavy lifting or straining until approved by your doctor. a. You may drive when you are no longer taking prescription pain medication, you can comfortably wear a seatbelt, and you can safely maneuver your car and apply brakes. 9. You should see your doctor in the office for a follow-up appointment approximately 2-3 weeks after your surgery.  Make sure that you call for this appointment within a day or two after you arrive home to insure a convenient appointment time. 10. OTHER INSTRUCTIONS: LAURIE WILL CALL YOU ABOUT GETTING STARTED ON THE HIGH PROTEIN/LOW CARB DIET TO HELP SHRINK YOUR LIVER  WHEN TO CALL YOUR DOCTOR: 1. Fever over 101.0 2. Inability to urinate 3. Continued bleeding from incision. 4. Increased pain, redness, or drainage from the incision. 5. Increasing abdominal pain  The clinic staff is available to answer your questions during regular business hours.  Please dont hesitate to call and ask to speak to one of the nurses for clinical concerns.  If you have a medical emergency, go to the nearest emergency room or call 911.  A surgeon from [facility] is always on call at the hospital. [address], [address], [place], [place]  [address] ? [address] [address], [place], [place]   [address] [phone] ? [phone] ? FAX [phone] Web site: [email or web address]

## 2016-03-16 NOTE — Anesthesia Postprocedure Evaluation (Signed)
Anesthesia Post Note  Patient: Gabriela Haney  Procedure(s) Performed: Procedure(s) (LRB): DIAGNOSTIC LAPAROSCOPY LYSIS OF ADHESIONS (N/A)  Patient location during evaluation: PACU Anesthesia Type: General Level of consciousness: awake and alert Pain management: pain level controlled Vital Signs Assessment: post-procedure vital signs reviewed and stable Respiratory status: spontaneous breathing, nonlabored ventilation, respiratory function stable and patient connected to nasal cannula oxygen Cardiovascular status: blood pressure returned to baseline and stable Postop Assessment: no signs of nausea or vomiting Anesthetic complications: no    Last Vitals:  Filed Vitals:   03/16/16 0930 03/16/16 1021  BP: 128/70 145/79  Pulse: 103 104  Temp: 36.9 C   Resp: 18 18    Last Pain: There were no vitals filed for this visit.               Jalena Vanderlinden DAVID

## 2016-03-16 NOTE — Interval H&P Note (Signed)
History and Physical Interval Note:  03/16/2016 7:11 AM  Gabriela Haney  has presented today for surgery, with the diagnosis of Morbid Obesity  The various methods of treatment have been discussed with the patient and family. After consideration of risks, benefits and other options for treatment, the patient has consented to  Procedure(s): LAPAROSCOPIC GASTRIC SLEEVE RESECTION, UPPER ENDO (N/A) as a surgical intervention .  The patient's history has been reviewed, patient examined, no change in status, stable for surgery.  I have reviewed the patient's chart and labs.  Questions were answered to the patient's satisfaction.   Mary SellaEric Haney. Andrey CampanileWilson, MD, FACS General, Bariatric, & Minimally Invasive Surgery [facility], [place]   [facility]ILSON,Gabriela Haney

## 2016-03-16 NOTE — H&P (View-Only) (Signed)
Gabriela Haney 03/12/2016 3:00 PM Location: [facility] Patient #: [id number] DOB: 03-04-1971 Single / Language: Cleophus Molt / Race: Black or African American Female  History of Present Illness Randall Hiss M. [name] MD; 03/12/2016 5:51 PM) The patient is a 45 year old female who presents for a pre-op visit. She comes in today for her preoperative visit she has been scheduled and approved for gastric sleeve. Surgery is May 23. I initially met her January 4. Her weight at that time was 306 pounds with a BMI of 48. She denies any medical changes since she was initially evaluated. She denies any trips to the emergency room or hospital. She denies any chest pain, chest pressure, shortness of breath, dyspnea on exertion, she denies smoking. She does have some reflux and takes reflux medication daily. She denies any issues with solid foods or tolerating liquids.  Her upper GI showed mild reflux otherwise no hiatal hernia or esophageal dysmotility. Her chest x-ray was within normal limits. She had had complete blood work at her primary care doctor's office a few days before her initial visit. 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 Gallbladder  Surgery - Laparoscopic Cataract Surgery Left.  Diagnostic Studies History Gayland Curry, MD; 03/12/2016 5:52 PM) Mammogram within last year Colonoscopy never Pap Smear 1-5 years ago  Allergies Gayland Curry, MD; 03/12/2016 5:52 PM) No Known Drug Allergies 10/29/2015  Medication History Gayland Curry, MD; 03/12/2016 5:52 PM) Omeprazole (40MG Capsule DR, Oral) Active. Etodolac (400MG Tablet, Oral) Active. Medications Reconciled OxyCODONE HCl (5MG/5ML Solution, 5-10 Milliliter Oral every four hours, as needed, Taken starting 03/12/2016) Active. Zofran (4MG Tablet, 1 (one) Tablet Oral every six hours, Taken starting 03/12/2016) Active. Loratadine (10MG Tablet, Oral) Active. Montelukast Sodium (10MG Tablet, Oral) Active. HydroCHLOROthiazide (25MG Tablet, Oral) Active. Vitamin D (Ergocalciferol) (50000UNIT Capsule, Oral) Active. Hydrocodone-Acetaminophen (10-325MG Tablet, Oral) Active. ALPRAZolam (0.5MG Tablet, Oral) Active. MetFORMIN HCl (500MG Tablet, Oral) Active. Fish Oil (1000MG Capsule, Oral) Active. Garcinia Cambogia XT Active.  Social History Gayland Curry, MD; 03/12/2016 5:52 PM) Tobacco use Former smoker. Illicit drug use Remotely quit drug use. Caffeine use Carbonated beverages, Coffee, Tea. Alcohol use Occasional alcohol use.  Family History Gayland Curry, MD; 03/12/2016 5:52 PM) Alcohol Abuse Family Members In General. Hypertension Family Members In General, Father, Mother, Sister. Diabetes Mellitus Family Members In General, Mother. Breast Cancer Family Members In General. Arthritis Family Members In General, Sister, Son. Depression Family Members In General. Cervical Cancer Sister.  Pregnancy / Birth History Gayland Curry, MD; 03/12/2016 5:52 PM) Age at menarche 2 years. Maternal age <15 Gravida 24 Para 2 Regular periods     Review of Systems Randall Hiss M. [name] MD; 03/12/2016 5:48 PM) General Present- Night Sweats and  Weight Gain. Not Present- Appetite Loss, Chills, Fatigue, Fever and Weight Loss. Skin Not Present- Change in Wart/Mole, Dryness, Hives, Jaundice,  New Lesions, Non-Healing Wounds, Rash and Ulcer. HEENT Present- Wears glasses/contact lenses. Not Present- Earache, Hearing Loss, Hoarseness, Nose Bleed, Oral Ulcers, Ringing in the Ears, Seasonal Allergies, Sinus Pain, Sore Throat, Visual Disturbances and Yellow Eyes. Respiratory Present- Snoring. Not Present- Bloody sputum, Chronic Cough, Difficulty Breathing and Wheezing. Breast Not Present- Breast Mass, Breast Pain, Nipple Discharge and Skin Changes. Cardiovascular Present- Shortness of Breath. Not Present- Chest Pain, Difficulty Breathing Lying Down, Leg Cramps, Palpitations, Rapid Heart Rate and Swelling of Extremities. Gastrointestinal Not Present- Abdominal Pain, Bloating, Bloody Stool, Change in Bowel Habits, Chronic diarrhea, Constipation, Difficulty Swallowing, Excessive gas, Gets full quickly at meals, Hemorrhoids, Indigestion, Nausea, Rectal Pain and Vomiting. Female Genitourinary Present- Pelvic Pain. Not Present- Frequency, Nocturia, Painful Urination and Urgency. Musculoskeletal Present- Back Pain, Joint Pain and Joint Stiffness. Not Present- Muscle Pain, Muscle Weakness and Swelling of Extremities. Neurological Present- Trouble walking. Not Present- Decreased Memory, Fainting, Headaches, Numbness, Seizures, Tingling, Tremor and Weakness. Psychiatric Present- Anxiety and Bipolar. Not Present- Change in Sleep Pattern, Depression, Fearful and Frequent crying. Endocrine Present- Excessive Hunger and Hot flashes. Not Present- Cold Intolerance, Hair Changes, Heat Intolerance and New Diabetes. Hematology Not Present- Easy Bruising, Excessive bleeding, Gland problems, HIV and Persistent Infections.  Vitals (Alisha Spillers CMA; 03/12/2016 3:01 PM) 03/12/2016 3:00 PM Weight: 304 lb Height: 66.75in Body Surface Area: 2.41 m Body Mass Index:  47.97 kg/m  Pulse: 119 (Regular)  BP: 138/82 (Sitting, Left Arm, Standard)      Physical Exam Randall Hiss M. Marin Wisner MD; 03/12/2016 5:48 PM)  General Mental Status-Alert. General Appearance-Consistent with stated age. Hydration-Well hydrated. Voice-Normal. Note: morbidly obese, mainly central truncal  Head and Neck Head-normocephalic, atraumatic with no lesions or palpable masses. Trachea-midline. Thyroid Gland Characteristics - normal size and consistency.  Eye Eyeball - Bilateral-Extraocular movements intact. Sclera/Conjunctiva - Bilateral-No scleral icterus.  Chest and Lung Exam Chest and lung exam reveals -quiet, even and easy respiratory effort with no use of accessory muscles and on auscultation, normal breath sounds, no adventitious sounds and normal vocal resonance. Inspection Chest Wall - Normal. Back - normal.  Breast - Did not examine.  Cardiovascular Cardiovascular examination reveals -normal heart sounds, regular rate and rhythm with no murmurs and normal pedal pulses bilaterally.  Abdomen Inspection Inspection of the abdomen reveals - No Hernias. Skin - Scar - Note: small umbilical incision; old trocar scars. Palpation/Percussion Palpation and Percussion of the abdomen reveal - Soft, Non Tender, No Rebound tenderness, No Rigidity (guarding) and No hepatosplenomegaly. Auscultation Auscultation of the abdomen reveals - Bowel sounds normal.  Peripheral Vascular Upper Extremity Palpation - Pulses bilaterally normal.  Neurologic Neurologic evaluation reveals -alert and oriented x 3 with no impairment of recent or remote memory. Mental Status-Normal.  Neuropsychiatric The patient's mood and affect are described as -normal. Judgment and Insight-insight is appropriate concerning matters relevant to self.  Musculoskeletal Normal Exam - Left-Upper Extremity Strength Normal and Lower Extremity Strength Normal. Normal Exam -  Right-Upper Extremity Strength Normal and Lower Extremity Strength Normal. Note: bilateral knee crepitus  Lymphatic Head & Neck  General Head & Neck Lymphatics: Bilateral - Description - Normal. Axillary - Did not examine. Femoral & Inguinal - Did not examine.    Assessment & Plan Randall Hiss M. Lavoris Canizales MD; 03/12/2016 5:52 PM)  MORBID OBESITY WITH BMI OF 45.0-49.9, ADULT (E66.01) Impression: We discussed her preoperative workup. We reviewed the findings of her upper GI and discussed her blood work done and her primary care physician's office. I believe she is still safe  for sleeve gastrectomy. She was given her postoperative pain and nausea prescriptions today. We discussed the preoperative diet. We rediscussed a typical postoperative recovery course as well as the typical issue with nausea and epigastric discomfort immediately after surgery. All of her questions were asked and answered.  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 Redmond Pulling, MD, FACS General, Bariatric, & Minimally Invasive Surgery [facility], [place]

## 2016-03-16 NOTE — Anesthesia Preprocedure Evaluation (Addendum)
Anesthesia Evaluation  Patient identified by MRN, date of birth, ID band Patient awake    Reviewed: Allergy & Precautions, NPO status , Patient's Chart, lab work & pertinent test results  Airway Mallampati: II  TM Distance: >3 FB Neck ROM: Full    Dental   Pulmonary sleep apnea , former smoker,    Pulmonary exam normal        Cardiovascular hypertension, Pt. on medications Normal cardiovascular exam     Neuro/Psych Anxiety Depression Bipolar Disorder    GI/Hepatic   Endo/Other  diabetes, Type 2, Oral Hypoglycemic Agents  Renal/GU      Musculoskeletal   Abdominal   Peds  Hematology   Anesthesia Other Findings   Reproductive/Obstetrics                            Anesthesia Physical Anesthesia Plan  ASA: III  Anesthesia Plan: General   Post-op Pain Management:    Induction: Intravenous  Airway Management Planned: Oral ETT  Additional Equipment:   Intra-op Plan:   Post-operative Plan: Extubation in OR  Informed Consent: I have reviewed the patients History and Physical, chart, labs and discussed the procedure including the risks, benefits and alternatives for the proposed anesthesia with the patient or authorized representative who has indicated his/her understanding and acceptance.     Plan Discussed with: CRNA and Surgeon  Anesthesia Plan Comments:         Anesthesia Quick Evaluation

## 2016-03-30 ENCOUNTER — Ambulatory Visit: Payer: Self-pay

## 2016-04-06 ENCOUNTER — Ambulatory Visit: Payer: Self-pay | Admitting: General Surgery

## 2016-04-06 NOTE — H&P (Signed)
Gabriela Haney 04/06/2016 3:45 PM Location: [facility] Patient #: [id number] DOB: 05-Dec-1970 Single / Language: [place] / Race: Black or African American Female  History of Present Illness Gabriela Areola M. [name] MD; 04/06/2016 4:10 PM) The patient is a 45 year old female who presents for a pre-op visit. She comes in today for her postoperative visit after diagnostic laparoscopy. Her weight loss surgery had to be aborted due to the size of her liver. She had a very fatty liver which completely obscured the stomach making surgery unsafe. She comes in today to make sure she is healing well from surgery as well as talk about additional weight loss prior to reattachment. She has lost about 15 pounds since her preoperative visit. She did have an episode of muscle weakness, nausea, vomiting, cramps. This prompted her to go to the emergency room last week. She states that she had an exhaustive workup and was found to be hypokalemic. She has had no additional symptoms then. She states that she had been taking her potassium supplement. She denies any chest pain, chest pressure, shortness of breath, dyspnea on exertion, she denies smoking. She does have some reflux and takes reflux medication daily. She denies any issues with solid foods or tolerating liquids.  Her upper GI showed mild reflux otherwise no hiatal hernia or esophageal dysmotility. Her chest x-ray was within normal limits. She had had complete blood work at her primary care doctor's office a few days before her initial visit. 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 Gallbladder Surgery - Laparoscopic Ventral / Umbilical Hernia Surgery Right. Hemorrhoidectomy Resection of Stomach  Diagnostic Studies History Gabriela Ina, MD; 04/06/2016 4:13 PM) Pap Smear 1-5 years ago Colonoscopy never Mammogram within last year  Allergies Gabriela Haney, CMA; 04/06/2016 3:45 PM) No Known Drug Allergies 10/29/2015  Medication History Gabriela Haney, CMA; 04/06/2016 3:45 PM) OxyCODONE HCl ( /5ML Solution, 5-10 Milliliter Oral every four hours, as needed, Taken starting 03/12/2016) Active. Zofran (  Tablet, 1 (one) Tablet Oral every six hours, Taken starting 03/12/2016) Active. Loratadine (  Tablet, Oral) Active. Montelukast Sodium (  Tablet, Oral) Active. HydroCHLOROthiazide (  Tablet, Oral) Active. Vitamin D (Ergocalciferol) (50000UNIT Capsule, Oral) Active. Hydrocodone-Acetaminophen (10-325MG  Tablet, Oral) Active. ALPRAZolam (0.5MG  Tablet, Oral) Active. MetFORMIN HCl (  Tablet, Oral) Active. Fish Oil (  Capsule, Oral) Active. Garcinia Cambogia XT Active. Omeprazole (  Capsule DR, Oral) Active. Etodolac (  Tablet, Oral) Active. Medications Reconciled  Social History Gabriela Ina, MD; 04/06/2016 4:13 PM) Alcohol use Occasional alcohol use. Caffeine use Carbonated beverages, Coffee, Tea. Illicit drug use Remotely quit drug use. Tobacco use Former smoker.  Family History Gabriela Ina, MD; 04/06/2016 4:13 PM) Diabetes Mellitus Family Members In General, Mother. Arthritis Family Members In [place], Sister, Son. Breast Cancer Family Members In  General. Hypertension Family Members In General, Father, Mother, Sister. Depression Family Members In General. Cervical Cancer Sister. Alcohol Abuse Family Members In General.  Pregnancy / Birth History Gabriela Haney(Gabriela Gabriela M Justin Buechner, MD; 04/06/2016 4:13 PM) Age at menarche 13 years. Gravida 4 Maternal age <15 Regular periods Para 2    Vitals Gabriela Haney(Gabriela Haney CMA; 04/06/2016 3:46 PM) 04/06/2016 3:45 PM Weight: 290 lb Height: 66.75in Body Surface Area: 2.36 m Body Mass Index: 45.76 kg/m  Temp.: 98.58F(Temporal)  Pulse: 100 (Regular)  BP: 134/86 (Sitting, Left Arm, Standard)      Physical Exam Gabriela Areola(Gabriela Mcguinness M. Chinita Schimpf MD; 04/06/2016 4:07 PM)  General Mental Status-Alert. General Appearance-Consistent with stated age. Hydration-Well hydrated. Voice-Normal. Note: morbidly obese, mainly central truncal  Head and Neck Head-normocephalic, atraumatic with no lesions or palpable masses. Trachea-midline. Thyroid Gland Characteristics - normal size and consistency.  Eye Eyeball - Bilateral-Extraocular movements intact. Sclera/Conjunctiva - Bilateral-No scleral icterus.  Chest and Lung Exam Chest and lung exam reveals -quiet, even and easy respiratory effort with no use of accessory muscles and on auscultation, normal breath sounds, no adventitious sounds and normal vocal resonance. Inspection Chest Wall - Normal. Back - normal.  Breast - Did not examine.  Cardiovascular Cardiovascular examination reveals -normal heart sounds, regular rate and rhythm with no murmurs and normal pedal pulses bilaterally.  Abdomen Inspection Inspection of the abdomen reveals - No Hernias. Skin - Scar - Note: small umbilical incision; old trocar scars; healing trocar scars. Palpation/Percussion Palpation and Percussion of the abdomen reveal - Soft, Non Tender, No Rebound tenderness, No Rigidity (guarding) and No hepatosplenomegaly. Auscultation Auscultation of the abdomen  reveals - Bowel sounds normal.  Peripheral Vascular Upper Extremity Palpation - Pulses bilaterally normal.  Neurologic Neurologic evaluation reveals -alert and oriented x 3 with no impairment of recent or remote memory. Mental Status-Normal.  Neuropsychiatric The patient's mood and affect are described as -normal. Judgment and Insight-insight is appropriate concerning matters relevant to self.  Musculoskeletal Normal Exam - Left-Upper Extremity Strength Normal and Lower Extremity Strength Normal. Normal Exam - Right-Upper Extremity Strength Normal and Lower Extremity Strength Normal. Note: bilateral knee crepitus  Lymphatic Head & Neck  General Head & Neck Lymphatics: Bilateral - Description - Normal. Axillary - Did not examine. Femoral & Inguinal - Did not examine.    Assessment & Plan Gabriela Areola(Deanette Tullius M. Wrenn Willcox MD; 04/06/2016 4:13 PM)  MORBID OBESITY WITH BMI OF 45.0-49.9, ADULT (E66.01) Impression: We rediscussed the intraoperative findings of her fatty liver. She has had normal liver function test in the past. We discussed the importance of compliance with the preoperative diet planning with losing weight in order to make surgery safe and technically possible. I congratulated her with her 15 pound weight loss. I would like her to lose an additional 15 pounds to make her total weight loss of about 30 pounds. We will put her back on the surgery schedule for about 1 month to 6 weeks from now which will give her some additional time to lose the remaining weight. I believe she is still safe for sleeve gastrectomy. She has her postoperative pain and nausea prescriptions from her last visit. We discussed the preoperative diet. We rediscussed a typical postoperative recovery course as well as the typical issue with nausea and epigastric discomfort immediately after surgery. All of her questions were asked and answered.  Current Plans Pt Education - EMW_preopbariatric You are being  scheduled for surgery - Our schedulers will call you.  You should hear from our office's scheduling department within 5 working days about the location, date, and time of surgery. We try to make accommodations for patient's preferences in scheduling surgery, but sometimes  the OR schedule or the surgeon's schedule prevents us from making those accommodations.  If you have not heard from our office [phone]) in 5 working days, call the office and ask for your surgeon's nurse.  If you have other questions about your diagnosis, plan, or surgery, call the office and ask for your surgeon's nurse.  VITAMIN D DEFICIENCY (E55.9) Impression: Patient reports she is taking a vitamin D supplement  DYSLIPIDEMIA (E78.5)  Mary SellaEric M. Andrey CampanileWilson, MD, FACS General, Bariatric, & Minimally Invasive Surgery [facility], [place]

## 2016-04-29 ENCOUNTER — Other Ambulatory Visit (HOSPITAL_COMMUNITY): Payer: Self-pay | Admitting: Anesthesiology

## 2016-04-29 NOTE — Patient Instructions (Addendum)
Gabriela Haney  04/29/2016   Your procedure is scheduled on: 05/03/16  [facility]  Entrance take [facility]  elevators to 3rd floor to  Short Stay Center at [phone] AM.  Call this number if you have problems the morning of surgery [phone]   Remember: ONLY 1 PERSON MAY GO WITH YOU TO SHORT STAY TO GET  READY MORNING OF YOUR SURGERY.  Do not eat food or drink liquids :After Midnight.     Take these medicines the morning of surgery with A SIP OF WATER:claritin(loratadine), omeprazole(prilosec), Singulair  May take oxycodone if needed DO NOT TAKE ANY DIABETIC MEDICATIONS DAY OF YOUR SURGERY                               You may not have any metal on your body including hair pins and              piercings  Do not wear jewelry, make-up, lotions, powders or perfumes, deodorant             Do not wear nail polish.  Do not shave  48 hours prior to surgery.              Men may shave face and neck.   Do not bring valuables to the hospital. [facility] IS NOT             RESPONSIBLE   FOR VALUABLES.  Contacts, dentures or bridgework may not be worn into surgery.  Leave suitcase in the car. After surgery it may be brought to your room.                 Please read over the following fact sheets you were given: _____________________________________________________________________             [facility] - Pre[facility] Before surgery, you can play an important role.  Because skin is not sterile, your skin needs to be as free of germs as possible.  You can reduce the number of germs on your skin by washing with CHG (chlorahexidine gluconate) soap before surgery.  CHG is an antiseptic cleaner which kills germs and bonds with the skin to continue killing germs even after washing. Please DO NOT use if you have an allergy to CHG or antibacterial soaps.  If your skin becomes reddened/irritated stop using the CHG and inform your nurse when you arrive at Short  Stay. Do not shave (including legs and underarms) for at least 48 hours prior to the first CHG shower.  You may shave your face/neck. Please follow these instructions carefully:  1.  Shower with CHG Soap the night before surgery and the  morning of Surgery.  2.  If you choose to wash your hair, wash your hair first as usual with your  normal  shampoo.  3.  After you shampoo, rinse your hair and body thoroughly to remove the  shampoo.                           4.  Use CHG as you would any other liquid soap.  You can apply chg directly  to the skin and wash  Gently with a scrungie or clean washcloth.  5.  Apply the CHG Soap to your body ONLY FROM THE NECK DOWN.   Do not use on face/ open                           Wound or open sores. Avoid contact with eyes, ears mouth and genitals (private parts).                       Wash face,  Genitals (private parts) with your normal soap.             6.  Wash thoroughly, paying special attention to the area where your surgery  will be performed.  7.  Thoroughly rinse your body with warm water from the neck down.  8.  DO NOT shower/wash with your normal soap after using and rinsing off  the CHG Soap.                9.  Pat yourself dry with a clean towel.            10.  Wear clean pajamas.            11.  Place clean sheets on your bed the night of your first shower and do not  sleep with pets. Day of Surgery : Do not apply any lotions/deodorants the morning of surgery.  Please wear clean clothes to the hospital/surgery center.  FAILURE TO FOLLOW THESE INSTRUCTIONS MAY RESULT IN THE CANCELLATION OF YOUR SURGERY PATIENT SIGNATURE_________________________________  NURSE SIGNATURE__________________________________  ________________________________________________________________________

## 2016-04-29 NOTE — Progress Notes (Signed)
Chest 3/17 ekg 3/17

## 2016-04-30 ENCOUNTER — Encounter (HOSPITAL_COMMUNITY)
Admission: RE | Admit: 2016-04-30 | Discharge: 2016-04-30 | Disposition: A | Discharge status: Home / Self Care | Payer: Medicare Other | Source: Ambulatory Visit | Attending: General Surgery | Admitting: General Surgery

## 2016-04-30 ENCOUNTER — Encounter (HOSPITAL_COMMUNITY): Payer: Self-pay

## 2016-04-30 DIAGNOSIS — Z01812 Encounter for preprocedural laboratory examination: Secondary | ICD-10-CM

## 2016-04-30 DIAGNOSIS — Z6841 Body Mass Index (BMI) 40.0 and over, adult: Secondary | ICD-10-CM | POA: Insufficient documentation

## 2016-04-30 HISTORY — DX: Unspecified cataract: H26.9

## 2016-04-30 HISTORY — DX: Fatty (change of) liver, not elsewhere classified: K76.0

## 2016-04-30 LAB — CBC WITH DIFFERENTIAL/PLATELET
BASOS ABS: 0 10*3/uL (ref 0.0–0.1)
Basophils Relative: 1 %
Eosinophils Absolute: 0.2 10*3/uL (ref 0.0–0.7)
Eosinophils Relative: 2 %
HEMATOCRIT: 39.8 % (ref 36.0–46.0)
HEMOGLOBIN: 13 g/dL (ref 12.0–15.0)
LYMPHS ABS: 3.6 10*3/uL (ref 0.7–4.0)
Lymphocytes Relative: 58 %
MCH: 25 pg — ABNORMAL LOW (ref 26.0–34.0)
MCHC: 32.7 g/dL (ref 30.0–36.0)
MCV: 76.7 fL — AB (ref 78.0–100.0)
MONOS PCT: 10 %
Monocytes Absolute: 0.6 10*3/uL (ref 0.1–1.0)
Neutro Abs: 1.8 10*3/uL (ref 1.7–7.7)
Neutrophils Relative %: 29 %
PLATELETS: 366 10*3/uL (ref 150–400)
RBC: 5.19 MIL/uL — AB (ref 3.87–5.11)
RDW: 16 % — ABNORMAL HIGH (ref 11.5–15.5)
WBC: 6.3 10*3/uL (ref 4.0–10.5)

## 2016-04-30 LAB — COMPREHENSIVE METABOLIC PANEL
ALT: 81 U/L — AB (ref 14–54)
AST: 57 U/L — AB (ref 15–41)
Albumin: 3.9 g/dL (ref 3.5–5.0)
Alkaline Phosphatase: 56 U/L (ref 38–126)
Anion gap: 9 (ref 5–15)
BILIRUBIN TOTAL: 0.5 mg/dL (ref 0.3–1.2)
BUN: 10 mg/dL (ref 6–20)
CHLORIDE: 104 mmol/L (ref 101–111)
CO2: 24 mmol/L (ref 22–32)
CREATININE: 0.86 mg/dL (ref 0.44–1.00)
Calcium: 9.4 mg/dL (ref 8.9–10.3)
GFR calc Af Amer: >60 mL/min (ref >60)
Glucose, Bld: 99 mg/dL (ref 65–99)
Potassium: 3.7 mmol/L (ref 3.5–5.1)
Sodium: 137 mmol/L (ref 135–145)
Total Protein: 8.4 g/dL — ABNORMAL HIGH (ref 6.5–8.1)

## 2016-04-30 LAB — HCG, SERUM, QUALITATIVE: Preg, Serum: NEGATIVE

## 2016-04-30 NOTE — Progress Notes (Signed)
hemaglobin A1C 04-08-16 [facility] on chart

## 2016-05-03 ENCOUNTER — Inpatient Hospital Stay (HOSPITAL_COMMUNITY): Payer: Medicare Other | Admitting: Certified Registered Nurse Anesthetist

## 2016-05-03 ENCOUNTER — Inpatient Hospital Stay (HOSPITAL_COMMUNITY)
Admission: RE | Admit: 2016-05-03 | Discharge: 2016-05-05 | DRG: 621 | Disposition: A | Discharge status: Home / Self Care | Payer: Medicare Other | Source: Ambulatory Visit | Attending: General Surgery | Admitting: General Surgery

## 2016-05-03 ENCOUNTER — Encounter (HOSPITAL_COMMUNITY)
Admission: RE | Disposition: A | Discharge status: Home / Self Care | Payer: Self-pay | Source: Ambulatory Visit | Attending: General Surgery

## 2016-05-03 ENCOUNTER — Encounter (HOSPITAL_COMMUNITY): Payer: Self-pay | Admitting: Certified Registered Nurse Anesthetist

## 2016-05-03 DIAGNOSIS — E876 Hypokalemia: Secondary | ICD-10-CM | POA: Diagnosis present

## 2016-05-03 DIAGNOSIS — Z8049 Family history of malignant neoplasm of other genital organs: Secondary | ICD-10-CM

## 2016-05-03 DIAGNOSIS — E785 Hyperlipidemia, unspecified: Secondary | ICD-10-CM | POA: Diagnosis present

## 2016-05-03 DIAGNOSIS — E559 Vitamin D deficiency, unspecified: Secondary | ICD-10-CM | POA: Diagnosis present

## 2016-05-03 DIAGNOSIS — K660 Peritoneal adhesions (postprocedural) (postinfection): Secondary | ICD-10-CM | POA: Diagnosis present

## 2016-05-03 DIAGNOSIS — Z87891 Personal history of nicotine dependence: Secondary | ICD-10-CM | POA: Diagnosis not present

## 2016-05-03 DIAGNOSIS — G8929 Other chronic pain: Secondary | ICD-10-CM | POA: Diagnosis present

## 2016-05-03 DIAGNOSIS — M174 Other bilateral secondary osteoarthritis of knee: Secondary | ICD-10-CM | POA: Diagnosis present

## 2016-05-03 DIAGNOSIS — M545 Low back pain, unspecified: Secondary | ICD-10-CM | POA: Diagnosis present

## 2016-05-03 DIAGNOSIS — Z9989 Dependence on other enabling machines and devices: Secondary | ICD-10-CM

## 2016-05-03 DIAGNOSIS — Z79899 Other long term (current) drug therapy: Secondary | ICD-10-CM | POA: Diagnosis not present

## 2016-05-03 DIAGNOSIS — E119 Type 2 diabetes mellitus without complications: Secondary | ICD-10-CM

## 2016-05-03 DIAGNOSIS — E1169 Type 2 diabetes mellitus with other specified complication: Secondary | ICD-10-CM

## 2016-05-03 DIAGNOSIS — Z6841 Body Mass Index (BMI) 40.0 and over, adult: Secondary | ICD-10-CM | POA: Diagnosis not present

## 2016-05-03 DIAGNOSIS — Z01812 Encounter for preprocedural laboratory examination: Secondary | ICD-10-CM | POA: Diagnosis not present

## 2016-05-03 DIAGNOSIS — Z9884 Bariatric surgery status: Secondary | ICD-10-CM

## 2016-05-03 DIAGNOSIS — G4730 Sleep apnea, unspecified: Secondary | ICD-10-CM | POA: Diagnosis present

## 2016-05-03 DIAGNOSIS — G4733 Obstructive sleep apnea (adult) (pediatric): Secondary | ICD-10-CM | POA: Diagnosis present

## 2016-05-03 DIAGNOSIS — K219 Gastro-esophageal reflux disease without esophagitis: Secondary | ICD-10-CM | POA: Diagnosis present

## 2016-05-03 DIAGNOSIS — Z833 Family history of diabetes mellitus: Secondary | ICD-10-CM | POA: Diagnosis not present

## 2016-05-03 DIAGNOSIS — I10 Essential (primary) hypertension: Secondary | ICD-10-CM | POA: Diagnosis present

## 2016-05-03 DIAGNOSIS — E669 Obesity, unspecified: Secondary | ICD-10-CM

## 2016-05-03 DIAGNOSIS — K760 Fatty (change of) liver, not elsewhere classified: Secondary | ICD-10-CM | POA: Diagnosis present

## 2016-05-03 DIAGNOSIS — F419 Anxiety disorder, unspecified: Secondary | ICD-10-CM | POA: Diagnosis present

## 2016-05-03 DIAGNOSIS — F329 Major depressive disorder, single episode, unspecified: Secondary | ICD-10-CM | POA: Diagnosis present

## 2016-05-03 HISTORY — PX: LAPAROSCOPIC GASTRIC SLEEVE RESECTION: SHX5895

## 2016-05-03 LAB — HEMOGLOBIN AND HEMATOCRIT, BLOOD
HCT: 38.2 % (ref 36.0–46.0)
Hemoglobin: 12.3 g/dL (ref 12.0–15.0)

## 2016-05-03 LAB — GLUCOSE, CAPILLARY
GLUCOSE-CAPILLARY: 156 mg/dL — AB (ref 65–99)
GLUCOSE-CAPILLARY: 175 mg/dL — AB (ref 65–99)
Glucose-Capillary: 116 mg/dL — ABNORMAL HIGH (ref 65–99)
Glucose-Capillary: 212 mg/dL — ABNORMAL HIGH (ref 65–99)

## 2016-05-03 SURGERY — GASTRECTOMY, SLEEVE, LAPAROSCOPIC
Anesthesia: General | Site: Abdomen

## 2016-05-03 MED ORDER — HEPARIN SODIUM (PORCINE) 5000 UNIT/ML IJ SOLN
5000.0000 [IU] | INTRAMUSCULAR | Status: AC
  Administered 2016-05-03: 5000 [IU] via SUBCUTANEOUS
  Filled 2016-05-03: qty 1

## 2016-05-03 MED ORDER — ESMOLOL HCL 100 MG/10ML IV SOLN
INTRAVENOUS | Status: AC
  Filled 2016-05-03: qty 10

## 2016-05-03 MED ORDER — BUPIVACAINE-EPINEPHRINE (PF) 0.25% -1:200000 IJ SOLN
INTRAMUSCULAR | Status: DC | PRN
  Administered 2016-05-03: 5 mL via PERINEURAL
  Administered 2016-05-03: 30 mL via PERINEURAL

## 2016-05-03 MED ORDER — ONDANSETRON HCL 4 MG/2ML IJ SOLN
4.0000 mg | INTRAMUSCULAR | Status: DC | PRN
  Administered 2016-05-03 – 2016-05-05 (×3): 4 mg via INTRAVENOUS
  Filled 2016-05-03 (×3): qty 2

## 2016-05-03 MED ORDER — INSULIN ASPART 100 UNIT/ML ~~LOC~~ SOLN
0.0000 [IU] | SUBCUTANEOUS | Status: DC
  Administered 2016-05-03: 4 [IU] via SUBCUTANEOUS
  Administered 2016-05-03: 7 [IU] via SUBCUTANEOUS
  Administered 2016-05-04 – 2016-05-05 (×5): 3 [IU] via SUBCUTANEOUS

## 2016-05-03 MED ORDER — FENTANYL CITRATE (PF) 250 MCG/5ML IJ SOLN
INTRAMUSCULAR | Status: AC
  Filled 2016-05-03: qty 5

## 2016-05-03 MED ORDER — BUPIVACAINE LIPOSOME 1.3 % IJ SUSP
20.0000 mL | Freq: Once | INTRAMUSCULAR | Status: AC
Start: 2016-05-03 — End: 2016-05-03
  Administered 2016-05-03: 20 mL
  Filled 2016-05-03: qty 20

## 2016-05-03 MED ORDER — LABETALOL HCL 5 MG/ML IV SOLN
10.0000 mg | INTRAVENOUS | Status: AC | PRN
  Administered 2016-05-03 (×2): 10 mg via INTRAVENOUS

## 2016-05-03 MED ORDER — PROPOFOL 10 MG/ML IV BOLUS
INTRAVENOUS | Status: AC
  Filled 2016-05-03: qty 20

## 2016-05-03 MED ORDER — SUGAMMADEX SODIUM 200 MG/2ML IV SOLN
INTRAVENOUS | Status: AC
  Filled 2016-05-03: qty 4

## 2016-05-03 MED ORDER — CEFOTETAN DISODIUM-DEXTROSE 2-2.08 GM-% IV SOLR
INTRAVENOUS | Status: AC
  Filled 2016-05-03: qty 50

## 2016-05-03 MED ORDER — ACETAMINOPHEN 160 MG/5ML PO SOLN: 325.0000 mg | ORAL | Status: DC | PRN

## 2016-05-03 MED ORDER — LABETALOL HCL 5 MG/ML IV SOLN
INTRAVENOUS | Status: AC
  Filled 2016-05-03: qty 4

## 2016-05-03 MED ORDER — ESMOLOL HCL 100 MG/10ML IV SOLN
INTRAVENOUS | Status: DC | PRN
  Administered 2016-05-03: 10 mg via INTRAVENOUS

## 2016-05-03 MED ORDER — CHLORHEXIDINE GLUCONATE 4 % EX LIQD: 60.0000 mL | Freq: Once | CUTANEOUS | Status: DC

## 2016-05-03 MED ORDER — BUPIVACAINE-EPINEPHRINE 0.25% -1:200000 IJ SOLN
INTRAMUSCULAR | Status: AC
  Filled 2016-05-03: qty 1

## 2016-05-03 MED ORDER — MIDAZOLAM HCL 5 MG/5ML IJ SOLN
INTRAMUSCULAR | Status: DC | PRN
  Administered 2016-05-03 (×2): 1 mg via INTRAVENOUS

## 2016-05-03 MED ORDER — SODIUM CHLORIDE 0.9 % IJ SOLN
INTRAMUSCULAR | Status: DC | PRN
  Administered 2016-05-03: 50 mL

## 2016-05-03 MED ORDER — ACETAMINOPHEN 10 MG/ML IV SOLN
INTRAVENOUS | Status: AC
  Filled 2016-05-03: qty 100

## 2016-05-03 MED ORDER — CETYLPYRIDINIUM CHLORIDE 0.05 % MT LIQD
7.0000 mL | Freq: Two times a day (BID) | OROMUCOSAL | Status: DC
  Administered 2016-05-03 – 2016-05-04 (×2): 7 mL via OROMUCOSAL

## 2016-05-03 MED ORDER — DEXAMETHASONE SODIUM PHOSPHATE 10 MG/ML IJ SOLN
INTRAMUSCULAR | Status: AC
  Filled 2016-05-03: qty 1

## 2016-05-03 MED ORDER — HYDROMORPHONE HCL 1 MG/ML IJ SOLN
INTRAMUSCULAR | Status: AC
  Filled 2016-05-03: qty 1

## 2016-05-03 MED ORDER — CEFOTETAN DISODIUM-DEXTROSE 2-2.08 GM-% IV SOLR
2.0000 g | INTRAVENOUS | Status: AC
  Administered 2016-05-03: 2 g via INTRAVENOUS

## 2016-05-03 MED ORDER — LACTATED RINGERS IV SOLN
INTRAVENOUS | Status: DC | PRN
  Administered 2016-05-03 (×2): via INTRAVENOUS

## 2016-05-03 MED ORDER — METHOCARBAMOL 1000 MG/10ML IJ SOLN
500.0000 mg | Freq: Three times a day (TID) | INTRAVENOUS | Status: DC
  Administered 2016-05-03 – 2016-05-05 (×5): 500 mg via INTRAVENOUS
  Filled 2016-05-03 (×10): qty 5
  Filled 2016-05-03: qty 550
  Filled 2016-05-03 (×3): qty 5

## 2016-05-03 MED ORDER — POTASSIUM CHLORIDE IN NACL 20-0.45 MEQ/L-% IV SOLN
INTRAVENOUS | Status: DC
  Administered 2016-05-03: 17:00:00 via INTRAVENOUS
  Administered 2016-05-04 (×2): 1000 mL via INTRAVENOUS
  Administered 2016-05-04: 12:00:00 via INTRAVENOUS
  Administered 2016-05-05: 1000 mL via INTRAVENOUS
  Filled 2016-05-03 (×7): qty 1000

## 2016-05-03 MED ORDER — SUCCINYLCHOLINE CHLORIDE 20 MG/ML IJ SOLN
INTRAMUSCULAR | Status: DC | PRN
  Administered 2016-05-03: 140 mg via INTRAVENOUS

## 2016-05-03 MED ORDER — ACETAMINOPHEN 10 MG/ML IV SOLN
INTRAVENOUS | Status: DC | PRN
  Administered 2016-05-03: 1000 mg via INTRAVENOUS

## 2016-05-03 MED ORDER — MORPHINE SULFATE (PF) 2 MG/ML IV SOLN
2.0000 mg | INTRAVENOUS | Status: DC | PRN
  Administered 2016-05-03 (×2): 4 mg via INTRAVENOUS
  Administered 2016-05-03: 2 mg via INTRAVENOUS
  Administered 2016-05-03: 4 mg via INTRAVENOUS
  Administered 2016-05-04 (×2): 2 mg via INTRAVENOUS
  Administered 2016-05-04 (×2): 4 mg via INTRAVENOUS
  Filled 2016-05-03: qty 1
  Filled 2016-05-03 (×4): qty 2
  Filled 2016-05-03: qty 1
  Filled 2016-05-03: qty 2
  Filled 2016-05-03: qty 1
  Filled 2016-05-03: qty 2

## 2016-05-03 MED ORDER — PROCHLORPERAZINE EDISYLATE 5 MG/ML IJ SOLN
10.0000 mg | Freq: Four times a day (QID) | INTRAMUSCULAR | Status: DC | PRN
  Administered 2016-05-03 – 2016-05-04 (×3): 10 mg via INTRAVENOUS
  Filled 2016-05-03 (×3): qty 2

## 2016-05-03 MED ORDER — ONDANSETRON HCL 4 MG/2ML IJ SOLN
INTRAMUSCULAR | Status: AC
  Filled 2016-05-03: qty 2

## 2016-05-03 MED ORDER — LACTATED RINGERS IV SOLN
INTRAVENOUS | Status: DC
  Administered 2016-05-03: 1000 mL via INTRAVENOUS

## 2016-05-03 MED ORDER — ONDANSETRON HCL 4 MG/2ML IJ SOLN
INTRAMUSCULAR | Status: DC | PRN
  Administered 2016-05-03: 4 mg via INTRAVENOUS

## 2016-05-03 MED ORDER — FENTANYL CITRATE (PF) 100 MCG/2ML IJ SOLN
INTRAMUSCULAR | Status: DC | PRN
  Administered 2016-05-03 (×2): 50 ug via INTRAVENOUS
  Administered 2016-05-03: 100 ug via INTRAVENOUS
  Administered 2016-05-03: 150 ug via INTRAVENOUS
  Administered 2016-05-03: 25 ug via INTRAVENOUS
  Administered 2016-05-03: 100 ug via INTRAVENOUS
  Administered 2016-05-03: 25 ug via INTRAVENOUS

## 2016-05-03 MED ORDER — PROMETHAZINE HCL 25 MG/ML IJ SOLN
6.2500 mg | INTRAMUSCULAR | Status: DC | PRN
  Administered 2016-05-03: 12.5 mg via INTRAVENOUS

## 2016-05-03 MED ORDER — SUGAMMADEX SODIUM 500 MG/5ML IV SOLN
INTRAVENOUS | Status: DC | PRN
  Administered 2016-05-03: 200 mg via INTRAVENOUS
  Administered 2016-05-03 (×2): 100 mg via INTRAVENOUS

## 2016-05-03 MED ORDER — SODIUM CHLORIDE 0.9 % IJ SOLN
INTRAMUSCULAR | Status: AC
  Filled 2016-05-03: qty 50

## 2016-05-03 MED ORDER — LACTATED RINGERS IR SOLN
Status: DC | PRN
  Administered 2016-05-03: 3000 mL

## 2016-05-03 MED ORDER — LIDOCAINE HCL (CARDIAC) 20 MG/ML IV SOLN
INTRAVENOUS | Status: AC
  Filled 2016-05-03: qty 5

## 2016-05-03 MED ORDER — 0.9 % SODIUM CHLORIDE (POUR BTL) OPTIME
TOPICAL | Status: DC | PRN
  Administered 2016-05-03: 1000 mL

## 2016-05-03 MED ORDER — ROCURONIUM BROMIDE 100 MG/10ML IV SOLN
INTRAVENOUS | Status: DC | PRN
  Administered 2016-05-03: 40 mg via INTRAVENOUS
  Administered 2016-05-03: 20 mg via INTRAVENOUS
  Administered 2016-05-03 (×2): 10 mg via INTRAVENOUS

## 2016-05-03 MED ORDER — PROPOFOL 10 MG/ML IV BOLUS
INTRAVENOUS | Status: DC | PRN
  Administered 2016-05-03: 200 mg via INTRAVENOUS

## 2016-05-03 MED ORDER — ROCURONIUM BROMIDE 100 MG/10ML IV SOLN
INTRAVENOUS | Status: AC
  Filled 2016-05-03: qty 1

## 2016-05-03 MED ORDER — ACETAMINOPHEN 160 MG/5ML PO SOLN: 650.0000 mg | ORAL | Status: DC | PRN

## 2016-05-03 MED ORDER — ENOXAPARIN SODIUM 40 MG/0.4ML ~~LOC~~ SOLN
40.0000 mg | Freq: Two times a day (BID) | SUBCUTANEOUS | Status: DC
  Administered 2016-05-04 – 2016-05-05 (×3): 40 mg via SUBCUTANEOUS
  Filled 2016-05-03 (×3): qty 0.4

## 2016-05-03 MED ORDER — DEXAMETHASONE SODIUM PHOSPHATE 10 MG/ML IJ SOLN
INTRAMUSCULAR | Status: DC | PRN
  Administered 2016-05-03: 10 mg via INTRAVENOUS

## 2016-05-03 MED ORDER — PROMETHAZINE HCL 25 MG/ML IJ SOLN: 12.5000 mg | Freq: Once | INTRAMUSCULAR | Status: DC | PRN

## 2016-05-03 MED ORDER — LIDOCAINE HCL (CARDIAC) 20 MG/ML IV SOLN
INTRAVENOUS | Status: DC | PRN
  Administered 2016-05-03: 100 mg via INTRAVENOUS

## 2016-05-03 MED ORDER — LABETALOL HCL 5 MG/ML IV SOLN
INTRAVENOUS | Status: DC | PRN
  Administered 2016-05-03: 5 mg via INTRAVENOUS
  Administered 2016-05-03: 2.5 mg via INTRAVENOUS
  Administered 2016-05-03 (×2): 5 mg via INTRAVENOUS
  Administered 2016-05-03: 2.5 mg via INTRAVENOUS

## 2016-05-03 MED ORDER — OXYCODONE HCL 5 MG/5ML PO SOLN
5.0000 mg | ORAL | Status: DC | PRN
  Administered 2016-05-04: 5 mg via ORAL
  Administered 2016-05-04: 10 mg via ORAL
  Filled 2016-05-03: qty 5
  Filled 2016-05-03: qty 10

## 2016-05-03 MED ORDER — MIDAZOLAM HCL 2 MG/2ML IJ SOLN
INTRAMUSCULAR | Status: AC
  Filled 2016-05-03: qty 2

## 2016-05-03 MED ORDER — PREMIER PROTEIN SHAKE: 2.0000 [oz_av] | ORAL | Status: DC

## 2016-05-03 MED ORDER — ACETAMINOPHEN 10 MG/ML IV SOLN: 1000.0000 mg | Freq: Once | INTRAVENOUS | Status: AC

## 2016-05-03 MED ORDER — HYDROMORPHONE HCL 1 MG/ML IJ SOLN
0.2500 mg | INTRAMUSCULAR | Status: DC | PRN
  Administered 2016-05-03 (×4): 0.5 mg via INTRAVENOUS

## 2016-05-03 MED ORDER — PROMETHAZINE HCL 25 MG/ML IJ SOLN
INTRAMUSCULAR | Status: AC
  Filled 2016-05-03: qty 1

## 2016-05-03 MED ORDER — SODIUM CHLORIDE 0.9 % IV SOLN: 20.0000 mL | Freq: Once | INTRAVENOUS | Status: DC

## 2016-05-03 MED ORDER — FAMOTIDINE IN NACL 20-0.9 MG/50ML-% IV SOLN
20.0000 mg | Freq: Two times a day (BID) | INTRAVENOUS | Status: DC
  Administered 2016-05-03 – 2016-05-04 (×3): 20 mg via INTRAVENOUS
  Filled 2016-05-03 (×4): qty 50

## 2016-05-03 MED ORDER — CHLORHEXIDINE GLUCONATE 0.12 % MT SOLN
15.0000 mL | Freq: Two times a day (BID) | OROMUCOSAL | Status: DC
  Administered 2016-05-03 – 2016-05-04 (×3): 15 mL via OROMUCOSAL
  Filled 2016-05-03 (×5): qty 15

## 2016-05-03 SURGICAL SUPPLY — 68 items
APPLICATOR COTTON TIP 6IN STRL (MISCELLANEOUS) IMPLANT
APPLIER CLIP ROT 10 11.4 M/L (STAPLE)
BLADE SURG SZ11 CARB STEEL (BLADE) ×3 IMPLANT
CABLE HIGH FREQUENCY MONO STRZ (ELECTRODE) IMPLANT
CHLORAPREP W/TINT 26ML (MISCELLANEOUS) ×6 IMPLANT
CLIP APPLIE ROT 10 11.4 M/L (STAPLE) IMPLANT
COVER SURGICAL LIGHT HANDLE (MISCELLANEOUS) ×3 IMPLANT
DERMABOND ADVANCED (GAUZE/BANDAGES/DRESSINGS) ×2
DERMABOND ADVANCED .7 DNX12 (GAUZE/BANDAGES/DRESSINGS) ×1 IMPLANT
DEVICE SUT QUICK LOAD TK 5 (STAPLE) IMPLANT
DEVICE SUT TI-KNOT TK 5X26 (MISCELLANEOUS) IMPLANT
DEVICE SUTURE ENDOST 10MM (ENDOMECHANICALS) IMPLANT
DEVICE TI KNOT TK5 (MISCELLANEOUS)
DEVICE TROCAR PUNCTURE CLOSURE (ENDOMECHANICALS) IMPLANT
DRAPE UTILITY XL STRL (DRAPES) ×6 IMPLANT
ELECT L-HOOK LAP 45CM DISP (ELECTROSURGICAL)
ELECT PENCIL ROCKER SW 15FT (MISCELLANEOUS) IMPLANT
ELECT REM PT RETURN 9FT ADLT (ELECTROSURGICAL) ×3
ELECTRODE L-HOOK LAP 45CM DISP (ELECTROSURGICAL) IMPLANT
ELECTRODE REM PT RTRN 9FT ADLT (ELECTROSURGICAL) ×1 IMPLANT
GAUZE SPONGE 4X4 12PLY STRL (GAUZE/BANDAGES/DRESSINGS) IMPLANT
GLOVE BIO SURGEON STRL SZ7.5 (GLOVE) ×3 IMPLANT
GLOVE INDICATOR 8.0 STRL GRN (GLOVE) ×3 IMPLANT
GOWN STRL REUS W/TWL XL LVL3 (GOWN DISPOSABLE) ×9 IMPLANT
HOVERMATT SINGLE USE (MISCELLANEOUS) ×3 IMPLANT
KIT BASIN OR (CUSTOM PROCEDURE TRAY) ×3 IMPLANT
MARKER SKIN DUAL TIP RULER LAB (MISCELLANEOUS) ×3 IMPLANT
NEEDLE SPNL 22GX3.5 QUINCKE BK (NEEDLE) ×3 IMPLANT
PACK UNIVERSAL I (CUSTOM PROCEDURE TRAY) ×3 IMPLANT
QUICK LOAD TK 5 (STAPLE)
RELOAD STAPLER 60MM BLK (STAPLE) ×2 IMPLANT
RELOAD STAPLER BLUE 60MM (STAPLE) ×3 IMPLANT
RELOAD STAPLER GOLD 60MM (STAPLE) ×2 IMPLANT
RELOAD STAPLER GREEN 60MM (STAPLE) ×1 IMPLANT
SCISSORS LAP 5X45 EPIX DISP (ENDOMECHANICALS) ×3 IMPLANT
SEALANT SURGICAL APPL DUAL CAN (MISCELLANEOUS) IMPLANT
SET IRRIG TUBING LAPAROSCOPIC (IRRIGATION / IRRIGATOR) ×3 IMPLANT
SHEARS HARMONIC ACE PLUS 45CM (MISCELLANEOUS) ×3 IMPLANT
SLEEVE ADV FIXATION 5X100MM (TROCAR) ×6 IMPLANT
SLEEVE GASTRECTOMY 40FR VISIGI (MISCELLANEOUS) ×3 IMPLANT
SOLUTION ANTI FOG 6CC (MISCELLANEOUS) ×3 IMPLANT
SPONGE LAP 18X18 X RAY DECT (DISPOSABLE) ×3 IMPLANT
STAPLER ECHELON BIOABSB 60 FLE (MISCELLANEOUS) ×21 IMPLANT
STAPLER ECHELON LONG 60 440 (INSTRUMENTS) ×3 IMPLANT
STAPLER RELOAD 60MM BLK (STAPLE) ×6
STAPLER RELOAD BLUE 60MM (STAPLE) ×9
STAPLER RELOAD GOLD 60MM (STAPLE) ×6
STAPLER RELOAD GREEN 60MM (STAPLE) ×3
SUT MNCRL AB 4-0 PS2 18 (SUTURE) ×6 IMPLANT
SUT SURGIDAC NAB ES-9 0 48 120 (SUTURE) IMPLANT
SUT VICRYL 0 TIES 12 18 (SUTURE) ×3 IMPLANT
SUT VICRYL 0 UR6 27IN ABS (SUTURE) ×6 IMPLANT
SYR 10ML ECCENTRIC (SYRINGE) ×3 IMPLANT
SYR 20CC LL (SYRINGE) ×3 IMPLANT
SYR 50ML LL SCALE MARK (SYRINGE) ×3 IMPLANT
TOWEL OR 17X26 10 PK STRL BLUE (TOWEL DISPOSABLE) ×3 IMPLANT
TOWEL OR NON WOVEN STRL DISP B (DISPOSABLE) ×3 IMPLANT
TRAY FOLEY W/METER SILVER 14FR (SET/KITS/TRAYS/PACK) IMPLANT
TRAY FOLEY W/METER SILVER 16FR (SET/KITS/TRAYS/PACK) IMPLANT
TROCAR ADV FIXATION 12X100MM (TROCAR) ×3 IMPLANT
TROCAR ADV FIXATION 5X100MM (TROCAR) ×3 IMPLANT
TROCAR BLADELESS 15MM (ENDOMECHANICALS) ×3 IMPLANT
TROCAR BLADELESS OPT 5 100 (ENDOMECHANICALS) ×3 IMPLANT
TUBING CONNECTING 10 (TUBING) ×2 IMPLANT
TUBING CONNECTING 10' (TUBING) ×1
TUBING ENDO SMARTCAP (MISCELLANEOUS) ×3 IMPLANT
TUBING INSUF HEATED (TUBING) ×3 IMPLANT
WATER STERILE IRR 1000ML POUR (IV SOLUTION) ×3 IMPLANT

## 2016-05-03 NOTE — Interval H&P Note (Signed)
History and Physical Interval Note:  05/03/2016 10:38 AM  Dot LanesKisha D Canton  has presented today for surgery, with the diagnosis of MORBID OBESITY  The various methods of treatment have been discussed with the patient and family. After consideration of risks, benefits and other options for treatment, the patient has consented to  Procedure(s): LAPAROSCOPIC GASTRIC SLEEVE RESECTION, UPPER ENDO (N/A) as a surgical intervention .  The patient's history has been reviewed, patient examined, no change in status, stable for surgery.  I have reviewed the patient's chart and labs.  Questions were answered to the patient's satisfaction.     Mary SellaEric M. Andrey CampanileWilson, MD, FACS General, Bariatric, & Minimally Invasive Surgery [facility], [place]   [facility],Carmelite Violet M

## 2016-05-03 NOTE — Anesthesia Procedure Notes (Signed)
Procedure Name: Intubation Date/Time: 05/03/2016 11:17 AM Performed by: Edison PaceGRAY, Lynda Capistran E Pre-anesthesia Checklist: Patient identified, Emergency Drugs available, Suction available, Patient being monitored and Timeout performed Patient Re-evaluated:Patient Re-evaluated prior to inductionOxygen Delivery Method: Circle system utilized Preoxygenation: Pre-oxygenation with 100% oxygen Intubation Type: IV induction Ventilation: Mask ventilation without difficulty Laryngoscope Size: Mac and 4 Grade View: Grade II Tube type: Oral Tube size: 7.5 mm Number of attempts: 1 Airway Equipment and Method: Stylet Placement Confirmation: ETT inserted through vocal cords under direct vision,  positive ETCO2 and breath sounds checked- equal and bilateral Secured at: 21 cm Tube secured with: Tape Dental Injury: Teeth and Oropharynx as per pre-operative assessment  Difficulty Due To: Difficulty was anticipated, Difficult Airway- due to large tongue and Difficult Airway- due to dentition Future Recommendations: Recommend- induction with short-acting agent, and alternative techniques readily available

## 2016-05-03 NOTE — Transfer of Care (Signed)
Immediate Anesthesia Transfer of Care Note  Patient: Gabriela Haney  Procedure(s) Performed: Procedure(s): LAPAROSCOPIC GASTRIC SLEEVE RESECTION, UPPER ENDO (N/A)  Patient Location: PACU  Anesthesia Type:General  Level of Consciousness: awake, oriented, patient cooperative, lethargic and responds to stimulation  Airway & Oxygen Therapy: Patient Spontanous Breathing and Patient connected to face mask oxygen  Post-op Assessment: Report given to RN, Post -op Vital signs reviewed and stable and Patient moving all extremities  Post vital signs: Reviewed and stable  Last Vitals:  Filed Vitals:   05/03/16 0856  BP: 129/94  Pulse: 105  Temp: 36.6 C  Resp: 18    Last Pain: There were no vitals filed for this visit.    Patients Stated Pain Goal: 4 (05/03/16 1024)  Complications: No apparent anesthesia complications

## 2016-05-03 NOTE — Op Note (Signed)
05/03/2016 Gabriela Haney 1970/12/29 [id number]   PRE-OPERATIVE DIAGNOSIS:     Morbid obesity BMI 45   OSA on CPAP   Diabetes mellitus type 2 in obese (HCC)   GERD (gastroesophageal reflux disease)   Chronic lower back pain   Other bilateral secondary osteoarthritis of knee   HTN (hypertension)   Fatty liver  POST-OPERATIVE DIAGNOSIS:  same  PROCEDURE:  Procedure(s): LAPAROSCOPIC SLEEVE GASTRECTOMY  UPPER GI ENDOSCOPY  SURGEON:  Surgeon(s): [name], MD FACS FASMBS  ASSISTANTS: Feliciana Rossetti MD  ANESTHESIA:   general  DRAINS: none   BOUGIE: 40 fr ViSiGi  LOCAL MEDICATIONS USED:  MARCAINE + Exparel  SPECIMEN:  Source of Specimen:  Greater curvature of stomach  DISPOSITION OF SPECIMEN:  PATHOLOGY  COUNTS:  YES  INDICATION FOR PROCEDURE: This is a very pleasant 45 y.o.-year-old morbidly obese female who has had unsuccessful attempts for sustained weight loss. We had previously attempted sleeve gastrectomy about 6 weeks ago but had to terminate the procedure before resecting/mobilizing the stomach due to her large fatty liver which covered the stomach making not safe to proceed. The patient was able to lose over 25lbs afterwards and we bring her back today.  The patient presents today for a planned laparoscopic sleeve gastrectomy with upper endoscopy. We have discussed the risk and benefits of the procedure extensively preoperatively. Please see my separate notes.  PROCEDURE: After obtaining informed consent and receiving 5000 units of subcutaneous heparin, the patient was brought to the operating room at [facility] and placed supine on the operating room table. General endotracheal anesthesia was established. Sequential compression devices were placed. A orogastric tube was placed. The patient's abdomen was prepped and draped in the usual standard surgical fashion. The patient received preoperative IV antibiotic. A surgical timeout was performed.  Access to the  abdomen was achieved using a 5 mm 0 laparoscope thru a 5 mm trocar In the left upper Quadrant 2 fingerbreadths below the left subcostal margin using the Optiview technique. Pneumoperitoneum was smoothly established up to 15 mm of mercury. The laparoscope was advanced and the abdominal cavity was surveilled. The patient had reaccumulated omental adhesions in her upper midline to her old mesh. A 5mm trocar was placed in left lateral abdominal wall thru old trocar site and the omental adhesions were taken down with harmonic. The patient was then placed in reverse Trendelenburg. There was no evidence of a hiatal hernia on laparoscopy - gap in the left and right crus anteriorly.  A 5 mm trocar was placed slightly above and to the left of the umbilicus under direct visualization.   A 5 mm trocar was placed in the lateral right upper quadrant along with a 15 mm trocar in the mid right abdomen. The [facility] liver retractor was placed under the left lobe of the liver through a 5 mm trocar incision site in the subxiphoid position. Her liver was still fatty but less bulky and were able to see the spleen and GE junction today compared to 6 weeks ago.   All under direct visualization after local had been infiltrated.  The stomach was inspected. It was completely decompressed and the orogastric tube was removed. Her preop UGI showed no hiatal hernia.   There was no anterior dimple that was obviously visible.   We identified the pylorus and measured 6 cm proximal to the pylorus and identified an area of where we would start taking down the short gastric vessels. Harmonic scalpel was used to take  down the short gastric vessels along the greater curvature of the stomach. We were able to enter the lesser sac. We continued to march along the greater curvature of the stomach taking down the short gastrics. As we approached the gastrosplenic ligament we took care in this area not to injure the spleen. We were able to take down  the entire gastrosplenic ligament. We then mobilized the fundus away from the left crus of diaphragm. There were a few posterior gastric avascular attachments which were taken down. This left the stomach completely mobilized. No vessels had been taken down along the lesser curvature of the stomach.  We then reidentified the pylorus. A 40Fr ViSiGi was then placed in the oropharynx and advanced down into the stomach and placed in the distal antrum and positioned along the lesser curvature. It was placed under suction which secured the 40Fr ViSiGi in place along the lesser curve. Her antrum seemed thick so I started with a black load Then using the Ethicon echelon 60 mm stapler with a black load with Seamguard, I placed a stapler along the antrum approximately 5 cm from the pylorus. The stapler was angled so that there is ample room at the angularis incisura. I then fired the first staple load after inspecting it posteriorly to ensure adequate space both anteriorly and posteriorly. At this point I still was not completely past the angularis so with another black load with Seamguard, I placed the stapler in position just inside the prior stapleline. We then rotated the stomach to insure that there was adequate anteriorly as well as posteriorly. The stapler was then fired. I used a 60mm green cartridge with seamguard. I ended up adding a 12mm trocar in the LUQ under direct visualization because I had a better approach with my stapler with this new trocar. At this point I started using 60 mm gold load staple cartridges with Seamguard. The echelon stapler was then repositioned with a 60 mm gold load with Seamguard and we continued to march up along the ViSiGi. My assistant was holding traction along the greater curvature stomach along the cauterized short gastric vessels ensuring that the stomach was symmetrically retracted. Prior to each firing of the staple, we rotated the stomach to ensure that there is adequate  stomach left.  As we approached the fundus, I used 60 mm blue cartridge with Seamguard aiming slightly lateral to the esophageal fat pad. Although the staples on this fire had completely gone thru the last part of the stomach it had not completely cut it. Therefore 1 additional 60 blue load was used to free the remaining stomach. The sleeve was inspected. There is no evidence of cork screw. The staple line appeared hemostatic. The CRNA inflated the ViSiGi to the green zone and the upper abdomen was flooded with saline. There were no bubbles. The sleeve was decompressed and the ViSiGi removed. My assistant scrubbed out and performed an upper endoscopy. The sleeve easily distended with air and the scope was easily advanced to the pylorus. There is no evidence of internal bleeding or cork screwing. There was no narrowing at the angularis. There is no evidence of bubbles. Please see his operative note for further details. The gastric sleeve was decompressed and the endoscope was removed.  The greater curvature the stomach was grasped with a laparoscopic grasper and removed from the 12 mm LUQ trocar site since there was no mesh in this location.   I then closed the 12 mm trocar site with 1 interrupted  0 Vicryl sutures through the fascia using the EFX shield closure device. I then closed the 15 mm trocar site with 1 interrupted 0 Vicryl sutures through the fascia using the EFX shield closure device. The closure was viewed laparoscopically and it was airtight. 70 cc of Exparel was then infiltrated in the preperitoneal spaces around the trocar sites.  The liver retractor was removed. Pneumoperitoneum was released. All trocar sites were closed with a 4-0 Monocryl in a subcuticular fashion followed by the application of Dermabond. I shaved off a skin mole in LUQ abdominal wall at the patient's request and placed dermabond over site. The patient was extubated and taken to the recovery room in stable condition. All needle,  instrument, and sponge counts were correct x2. There are no immediate complications  (2) 60 mm green with Seamguard (1) 60 mm green with Seamguard (2) 60 mm gold with seamguard (3) 60 mm blue with seamguard  PLAN OF CARE: Admit to inpatient   PATIENT DISPOSITION:  PACU - hemodynamically stable.   Delay start of Pharmacological VTE agent (>24hrs) due to surgical blood loss or risk of bleeding:  no  Mary SellaEric M. Andrey CampanileWilson, MD, FACS FASMBS General, Bariatric, & Minimally Invasive Surgery [facility], [place]

## 2016-05-03 NOTE — H&P (View-Only) (Signed)
Gabriela Haney 04/06/2016 3:45 PM Location: [facility] Patient #: [id number] DOB: Mar 22, 1971 Single / Language: Gabriela Haney / Race: Black or African American Female  History of Present Illness Gabriela Areola M. Valentin Benney MD; 04/06/2016 4:10 PM) The patient is a 45 year old female who presents for a pre-op visit. She comes in today for her postoperative visit after diagnostic laparoscopy. Her weight loss surgery had to be aborted due to the size of her liver. She had a very fatty liver which completely obscured the stomach making surgery unsafe. She comes in today to make sure she is healing well from surgery as well as talk about additional weight loss prior to reattachment. She has lost about 15 pounds since her preoperative visit. She did have an episode of muscle weakness, nausea, vomiting, cramps. This prompted her to go to the emergency room last week. She states that she had an exhaustive workup and was found to be hypokalemic. She has had no additional symptoms then. She states that she had been taking her potassium supplement. She denies any chest pain, chest pressure, shortness of breath, dyspnea on exertion, she denies smoking. She does have some reflux and takes reflux medication daily. She denies any issues with solid foods or tolerating liquids.  Her upper GI showed mild reflux otherwise no hiatal hernia or esophageal dysmotility. Her chest x-ray was within normal limits. She had had complete blood work at her primary care doctor's office a few days before her initial visit. 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 Gallbladder Surgery - Laparoscopic Ventral / Umbilical Hernia Surgery Right. Hemorrhoidectomy Resection of Stomach  Diagnostic Studies History Gabriela Ina, MD; 04/06/2016 4:13 PM) Pap Smear 1-5 years ago Colonoscopy never Mammogram within last year  Allergies Gabriela Haney, CMA; 04/06/2016 3:45 PM) No Known Drug Allergies 10/29/2015  Medication History Gabriela Haney, CMA; 04/06/2016 3:45 PM) OxyCODONE HCl ( /5ML Solution, 5-10 Milliliter Oral every four hours, as needed, Taken starting 03/12/2016) Active. Zofran (  Tablet, 1 (one) Tablet Oral every six hours, Taken starting 03/12/2016) Active. Loratadine (  Tablet, Oral) Active. Montelukast Sodium (  Tablet, Oral) Active. HydroCHLOROthiazide (  Tablet, Oral) Active. Vitamin D (Ergocalciferol) (50000UNIT Capsule, Oral) Active. Hydrocodone-Acetaminophen (10-325MG  Tablet, Oral) Active. ALPRAZolam (0.5MG  Tablet, Oral) Active. MetFORMIN HCl (  Tablet, Oral) Active. Fish Oil (  Capsule, Oral) Active. Garcinia Cambogia XT Active. Omeprazole (  Capsule DR, Oral) Active. Etodolac (  Tablet, Oral) Active. Medications Reconciled  Social History Gabriela Ina, MD; 04/06/2016 4:13 PM) Alcohol use Occasional alcohol use. Caffeine use Carbonated beverages, Coffee, Tea. Illicit drug use Remotely quit drug use. Tobacco use Former smoker.  Family History Gabriela Ina, MD; 04/06/2016 4:13 PM) Diabetes Mellitus Family Members In General, Mother. Arthritis Family Members In [place], Sister, Son. Breast Cancer Family Members In  General. Hypertension Family Members In General, Father, Mother, Sister. Depression Family Members In General. Cervical Cancer Sister. Alcohol Abuse Family Members In General.  Pregnancy / Birth History Gabriela Haney([name], MD; 04/06/2016 4:13 PM) Age at menarche 13 years. Gravida 4 Maternal age <15 Regular periods Para 2    Vitals Gabriela Haney([name]; 04/06/2016 3:46 PM) 04/06/2016 3:45 PM Weight: 290 lb Height: 66.75in Body Surface Area: 2.36 m Body Mass Index: 45.76 kg/m  Temp.: 98.58F(Temporal)  Pulse: 100 (Regular)  BP: 134/86 (Sitting, Left Arm, Standard)      Physical Exam Gabriela Areola([name] MD; 04/06/2016 4:07 PM)  General Mental Status-Alert. General Appearance-Consistent with stated age. Hydration-Well hydrated. Voice-Normal. Note: morbidly obese, mainly central truncal  Head and Neck Head-normocephalic, atraumatic with no lesions or palpable masses. Trachea-midline. Thyroid Gland Characteristics - normal size and consistency.  Eye Eyeball - Bilateral-Extraocular movements intact. Sclera/Conjunctiva - Bilateral-No scleral icterus.  Chest and Lung Exam Chest and lung exam reveals -quiet, even and easy respiratory effort with no use of accessory muscles and on auscultation, normal breath sounds, no adventitious sounds and normal vocal resonance. Inspection Chest Wall - Normal. Back - normal.  Breast - Did not examine.  Cardiovascular Cardiovascular examination reveals -normal heart sounds, regular rate and rhythm with no murmurs and normal pedal pulses bilaterally.  Abdomen Inspection Inspection of the abdomen reveals - No Hernias. Skin - Scar - Note: small umbilical incision; old trocar scars; healing trocar scars. Palpation/Percussion Palpation and Percussion of the abdomen reveal - Soft, Non Tender, No Rebound tenderness, No Rigidity (guarding) and No hepatosplenomegaly. Auscultation Auscultation of the abdomen  reveals - Bowel sounds normal.  Peripheral Vascular Upper Extremity Palpation - Pulses bilaterally normal.  Neurologic Neurologic evaluation reveals -alert and oriented x 3 with no impairment of recent or remote memory. Mental Status-Normal.  Neuropsychiatric The patient's mood and affect are described as -normal. Judgment and Insight-insight is appropriate concerning matters relevant to self.  Musculoskeletal Normal Exam - Left-Upper Extremity Strength Normal and Lower Extremity Strength Normal. Normal Exam - Right-Upper Extremity Strength Normal and Lower Extremity Strength Normal. Note: bilateral knee crepitus  Lymphatic Head & Neck  General Head & Neck Lymphatics: Bilateral - Description - Normal. Axillary - Did not examine. Femoral & Inguinal - Did not examine.    Assessment & Plan Gabriela Areola(Kaelie Henigan M. Zehra Rucci MD; 04/06/2016 4:13 PM)  MORBID OBESITY WITH BMI OF 45.0-49.9, ADULT (E66.01) Impression: We rediscussed the intraoperative findings of her fatty liver. She has had normal liver function test in the past. We discussed the importance of compliance with the preoperative diet planning with losing weight in order to make surgery safe and technically possible. I congratulated her with her 15 pound weight loss. I would like her to lose an additional 15 pounds to make her total weight loss of about 30 pounds. We will put her back on the surgery schedule for about 1 month to 6 weeks from now which will give her some additional time to lose the remaining weight. I believe she is still safe for sleeve gastrectomy. She has her postoperative pain and nausea prescriptions from her last visit. We discussed the preoperative diet. We rediscussed a typical postoperative recovery course as well as the typical issue with nausea and epigastric discomfort immediately after surgery. All of her questions were asked and answered.  Current Plans Pt Education - EMW_preopbariatric You are being  scheduled for surgery - Our schedulers will call you.  You should hear from our office's scheduling department within 5 working days about the location, date, and time of surgery. We try to make accommodations for patient's preferences in scheduling surgery, but sometimes  the OR schedule or the surgeon's schedule prevents us from making those accommodations.  If you have not heard from our office [phone]) in 5 working days, call the office and ask for your surgeon's nurse.  If you have other questions about your diagnosis, plan, or surgery, call the office and ask for your surgeon's nurse.  VITAMIN D DEFICIENCY (E55.9) Impression: Patient reports she is taking a vitamin D supplement  DYSLIPIDEMIA (E78.5)  Mary SellaEric M. Andrey CampanileWilson, MD, FACS General, Bariatric, & Minimally Invasive Surgery [facility], [place]

## 2016-05-03 NOTE — Op Note (Signed)
Preoperative diagnosis: laparoscopic sleeve gastrectomy  Postoperative diagnosis: Same   Procedure: Upper endoscopy   Surgeon: Feliciana RossettiLuke Miyako Oelke, M.D.  Anesthesia: Gen.   Indications for procedure: This patient was undergoing a laparoscopic sleeve gastrectomy.   Description of procedure: The endoscopy was placed in the mouth and into the oropharynx and under endoscopic vision it was advanced to the esophagogastric junction. The pouch was insufflated and no bleeding or bubbles were seen.No bleeding or leaks were detected. The scope was withdrawn without difficulty.   Feliciana RossettiLuke Ellyce Lafevers, M.D. General, Bariatric, & Minimally Invasive Surgery [facility], [place]

## 2016-05-03 NOTE — Anesthesia Preprocedure Evaluation (Signed)
Anesthesia Evaluation  Patient identified by MRN, date of birth, ID band Patient awake    Reviewed: Allergy & Precautions, NPO status , Patient's Chart, lab work & pertinent test results  Airway Mallampati: II  TM Distance: >3 FB Neck ROM: Full    Dental no notable dental hx.    Pulmonary sleep apnea , former smoker,    Pulmonary exam normal breath sounds clear to auscultation       Cardiovascular hypertension, Normal cardiovascular exam Rhythm:Regular Rate:Normal     Neuro/Psych Bipolar Disorder negative neurological ROS     GI/Hepatic negative GI ROS, Neg liver ROS,   Endo/Other  diabetes  Renal/GU negative Renal ROS  negative genitourinary   Musculoskeletal negative musculoskeletal ROS (+)   Abdominal   Peds negative pediatric ROS (+)  Hematology negative hematology ROS (+)   Anesthesia Other Findings   Reproductive/Obstetrics negative OB ROS                             Anesthesia Physical Anesthesia Plan  ASA: III  Anesthesia Plan: General   Post-op Pain Management:    Induction: Intravenous  Airway Management Planned: Oral ETT  Additional Equipment:   Intra-op Plan:   Post-operative Plan: Extubation in OR  Informed Consent: I have reviewed the patients History and Physical, chart, labs and discussed the procedure including the risks, benefits and alternatives for the proposed anesthesia with the patient or authorized representative who has indicated his/her understanding and acceptance.   Dental advisory given  Plan Discussed with: CRNA and Surgeon  Anesthesia Plan Comments:         Anesthesia Quick Evaluation

## 2016-05-03 NOTE — Anesthesia Postprocedure Evaluation (Signed)
Anesthesia Post Note  Patient: Gabriela Haney  Procedure(s) Performed: Procedure(s) (LRB): LAPAROSCOPIC GASTRIC SLEEVE RESECTION, UPPER ENDO (N/A)  Patient location during evaluation: PACU Anesthesia Type: General Level of consciousness: awake and alert Pain management: pain level controlled Vital Signs Assessment: post-procedure vital signs reviewed and stable Respiratory status: spontaneous breathing, nonlabored ventilation, respiratory function stable and patient connected to nasal cannula oxygen Cardiovascular status: blood pressure returned to baseline and stable Postop Assessment: no signs of nausea or vomiting Anesthetic complications: no    Last Vitals:  Filed Vitals:   05/03/16 1515 05/03/16 1532  BP: 178/98 145/75  Pulse: 93 96  Temp: 36.4 C 36.5 C  Resp: 27 20    Last Pain:  Filed Vitals:   05/03/16 1540  PainSc: 6                  Maika Mcelveen S

## 2016-05-04 ENCOUNTER — Encounter (HOSPITAL_COMMUNITY): Payer: Self-pay | Admitting: General Surgery

## 2016-05-04 LAB — GLUCOSE, CAPILLARY
GLUCOSE-CAPILLARY: 128 mg/dL — AB (ref 65–99)
GLUCOSE-CAPILLARY: 128 mg/dL — AB (ref 65–99)
Glucose-Capillary: 108 mg/dL — ABNORMAL HIGH (ref 65–99)
Glucose-Capillary: 119 mg/dL — ABNORMAL HIGH (ref 65–99)
Glucose-Capillary: 126 mg/dL — ABNORMAL HIGH (ref 65–99)
Glucose-Capillary: 130 mg/dL — ABNORMAL HIGH (ref 65–99)
Glucose-Capillary: 138 mg/dL — ABNORMAL HIGH (ref 65–99)

## 2016-05-04 LAB — CBC WITH DIFFERENTIAL/PLATELET
BASOS ABS: 0 10*3/uL (ref 0.0–0.1)
BASOS PCT: 0 %
EOS ABS: 0 10*3/uL (ref 0.0–0.7)
Eosinophils Relative: 0 %
HCT: 37 % (ref 36.0–46.0)
Hemoglobin: 11.6 g/dL — ABNORMAL LOW (ref 12.0–15.0)
LYMPHS ABS: 3 10*3/uL (ref 0.7–4.0)
LYMPHS PCT: 30 %
MCH: 24.4 pg — AB (ref 26.0–34.0)
MCHC: 31.4 g/dL (ref 30.0–36.0)
MCV: 77.9 fL — ABNORMAL LOW (ref 78.0–100.0)
MONO ABS: 0.9 10*3/uL (ref 0.1–1.0)
Monocytes Relative: 9 %
NEUTROS ABS: 6.2 10*3/uL (ref 1.7–7.7)
Neutrophils Relative %: 61 %
PLATELETS: 301 10*3/uL (ref 150–400)
RBC: 4.75 MIL/uL (ref 3.87–5.11)
RDW: 15.9 % — AB (ref 11.5–15.5)
WBC: 10.1 10*3/uL (ref 4.0–10.5)

## 2016-05-04 LAB — COMPREHENSIVE METABOLIC PANEL
ALBUMIN: 3.5 g/dL (ref 3.5–5.0)
ALT: 128 U/L — ABNORMAL HIGH (ref 14–54)
ANION GAP: 8 (ref 5–15)
AST: 104 U/L — ABNORMAL HIGH (ref 15–41)
Alkaline Phosphatase: 56 U/L (ref 38–126)
BUN: 11 mg/dL (ref 6–20)
CALCIUM: 9 mg/dL (ref 8.9–10.3)
CO2: 27 mmol/L (ref 22–32)
Chloride: 101 mmol/L (ref 101–111)
Creatinine, Ser: 0.84 mg/dL (ref 0.44–1.00)
GFR calc non Af Amer: >60 mL/min (ref >60)
GLUCOSE: 123 mg/dL — AB (ref 65–99)
POTASSIUM: 3.8 mmol/L (ref 3.5–5.1)
SODIUM: 136 mmol/L (ref 135–145)
TOTAL PROTEIN: 7.3 g/dL (ref 6.5–8.1)
Total Bilirubin: 0.5 mg/dL (ref 0.3–1.2)

## 2016-05-04 LAB — HEMOGLOBIN AND HEMATOCRIT, BLOOD
HCT: 36.9 % (ref 36.0–46.0)
HEMOGLOBIN: 11.7 g/dL — AB (ref 12.0–15.0)

## 2016-05-04 MED ORDER — PROMETHAZINE HCL 25 MG/ML IJ SOLN
12.5000 mg | Freq: Four times a day (QID) | INTRAMUSCULAR | Status: DC | PRN
  Filled 2016-05-04: qty 1

## 2016-05-04 MED ORDER — ENALAPRILAT 1.25 MG/ML IV SOLN
1.2500 mg | Freq: Four times a day (QID) | INTRAVENOUS | Status: DC | PRN
  Administered 2016-05-04 (×2): 1.25 mg via INTRAVENOUS
  Filled 2016-05-04 (×4): qty 1

## 2016-05-04 MED ORDER — ALUM & MAG HYDROXIDE-SIMETH 200-200-20 MG/5ML PO SUSP
15.0000 mL | Freq: Four times a day (QID) | ORAL | Status: DC | PRN
  Administered 2016-05-04: 15 mL via ORAL
  Filled 2016-05-04: qty 30

## 2016-05-04 MED ORDER — PREMIER PROTEIN SHAKE
2.0000 [oz_av] | ORAL | Status: DC
  Administered 2016-05-04 – 2016-05-05 (×3): 2 [oz_av] via ORAL

## 2016-05-04 NOTE — Progress Notes (Signed)
1 Day Post-Op  Subjective: Bed uncomfortable. Some nausea last night. Tolerated ice chips. Ambulated. Pain ok  Objective: Vital signs in last 24 hours: Temp:  [97.1 F (36.2 C)-98.3 F (36.8 C)] 98 F (36.7 C) (07/11 1320) Pulse Rate:  [103-106] 103 (07/11 1320) Resp:  [16-20] 18 (07/11 1320) BP: (131-150)/(71-98) 139/88 mmHg (07/11 1320) SpO2:  [96 %-100 %] 96 % (07/11 1320) Weight:  [130.455 kg (287 lb 9.6 oz)] 130.455 kg (287 lb 9.6 oz) (07/11 1025) Last BM Date: 05/02/16  Intake/Output from previous day: 07/10 0701 - 07/11 0700 In: 3062.5 [I.V.:3062.5] Out: 150 [Urine:150] Intake/Output this shift: Total I/O In: 1297.9 [P.O.:300; I.V.:997.9] Out: 350 [Urine:350]  Alert, nontoxic cta Reg Obese, soft, mild expected ttp, incisions c/d/i No edema, +scds.   Lab Results:   Recent Labs  05/04/16 0336 05/04/16 1537  WBC 10.1  --   HGB 11.6* 11.7*  HCT 37.0 36.9  PLT 301  --    BMET  Recent Labs  05/04/16 0336  NA 136  K 3.8  CL 101  CO2 27  GLUCOSE 123*  BUN 11  CREATININE 0.84  CALCIUM 9.0   PT/INR No results for input(s): LABPROT, INR in the last 72 hours. ABG No results for input(s): PHART, HCO3 in the last 72 hours.  Invalid input(s): PCO2, PO2  Studies/Results: No results found.  Anti-infectives: Anti-infectives    Start     Dose/Rate Route Frequency Ordered Stop   05/03/16 0912  cefoTEtan in Dextrose 5% (CEFOTAN) IVPB 2 g     2 g Intravenous On call to O.R. 05/03/16 0912 05/03/16 1100      Assessment/Plan: s/p Procedure(s): LAPAROSCOPIC GASTRIC SLEEVE RESECTION, UPPER ENDO (N/A) Pt seen and rounded on twice today.  Doing well. No clinical sign of leak.  Adv to protein shakes Ambulate pulm toilet Cont chemical vte prophylaxis  Intake is not sufficient/safe for dc today so will keep tonight.   Mary SellaEric M. Andrey CampanileWilson, MD, FACS General, Bariatric, & Minimally Invasive Surgery [facility], [place]   LOS: 1 day    Atilano InaWILSON,Mithcell Schumpert  M 05/04/2016

## 2016-05-04 NOTE — Plan of Care (Signed)
Problem: Food- and Nutrition-Related Knowledge Deficit (NB-1.1) Goal: Nutrition education Formal process to instruct or train a patient/client in a skill or to impart knowledge to help patients/clients voluntarily manage or modify food choices and eating behavior to maintain or improve health. Outcome: Completed/Met Date Met:  05/04/16 Nutrition Education Note  Received consult for diet education per DROP protocol.   Discussed 2 week post op diet with pt. Emphasized that liquids must be non carbonated, non caffeinated, and sugar free. Fluid goals discussed. Reviewed progression of diet to include soft proteins at 7-10 days post-op. Pt to follow up with outpatient bariatric RD for further diet progression after 2 weeks. Multivitamins and minerals also reviewed. Teach back method used, pt expressed understanding, expect good compliance.   Diet: First 2 Weeks  You will see the dietitian about two (2) weeks after your surgery. The dietitian will increase the types of foods you can eat if you are handling liquids well:  If you have severe vomiting or nausea and cannot handle clear liquids lasting longer than 1 day, call your surgeon  Protein Shake  Drink at least 2 ounces of shake 5-6 times per day  Each serving of protein shakes (usually 8 - 12 ounces) should have a minimum of:  15 grams of protein  And no more than 5 grams of carbohydrate  Goal for protein each day:  Men = 80 grams per day  Women = 60 grams per day  Protein powder may be added to fluids such as non-fat milk or Lactaid milk or Soy milk (limit to 35 grams added protein powder per serving)   Hydration  Slowly increase the amount of water and other clear liquids as tolerated (See Acceptable Fluids)  Slowly increase the amount of protein shake as tolerated  Sip fluids slowly and throughout the day  May use sugar substitutes in small amounts (no more than 6 - 8 packets per day; i.e. Splenda)   Fluid Goal  The first goal is to  drink at least 8 ounces of protein shake/drink per day (or as directed by the nutritionist); some examples of protein shakes are [employer], [employer], EAS Edge HP, and Unjury. See handout from pre-op Bariatric Education Class:  Slowly increase the amount of protein shake you drink as tolerated  You may find it easier to slowly sip shakes throughout the day  It is important to get your proteins in first  Your fluid goal is to drink 64 - 100 ounces of fluid daily  It may take a few weeks to build up to this  32 oz (or more) should be clear liquids  And  32 oz (or more) should be full liquids (see below for examples)  Liquids should not contain sugar, caffeine, or carbonation   Clear Liquids:  Water or Sugar-free flavored water (i.e. Fruit H2O, Propel)  Decaffeinated coffee or tea (sugar-free)  Crystal Lite, Wyler's Lite, Minute Maid Lite  Sugar-free Jell-O  Bouillon or broth  Sugar-free Popsicle: *Less than 20 calories each; Limit 1 per day   Full Liquids:  Protein Shakes/Drinks + 2 choices per day of other full liquids  Full liquids must be:  No More Than 12 grams of Carbs per serving  No More Than 3 grams of Fat per serving  Strained low-fat cream soup  Non-Fat milk  Fat-free Lactaid Milk  Sugar-free yogurt (Dannon Lite & Fit, Greek yogurt)     Gabriela Bibles, MS, RD, LDN Pager: [phone] After Hours Pager: [phone]

## 2016-05-05 LAB — CBC WITH DIFFERENTIAL/PLATELET
BASOS ABS: 0 10*3/uL (ref 0.0–0.1)
BASOS PCT: 1 %
Eosinophils Absolute: 0.3 10*3/uL (ref 0.0–0.7)
Eosinophils Relative: 4 %
HCT: 35.5 % — ABNORMAL LOW (ref 36.0–46.0)
Hemoglobin: 11.4 g/dL — ABNORMAL LOW (ref 12.0–15.0)
Lymphocytes Relative: 34 %
Lymphs Abs: 2.6 10*3/uL (ref 0.7–4.0)
MCH: 24.7 pg — ABNORMAL LOW (ref 26.0–34.0)
MCHC: 32.1 g/dL (ref 30.0–36.0)
MCV: 77 fL — ABNORMAL LOW (ref 78.0–100.0)
Monocytes Absolute: 0.7 10*3/uL (ref 0.1–1.0)
Monocytes Relative: 9 %
NEUTROS ABS: 4 10*3/uL (ref 1.7–7.7)
NEUTROS PCT: 52 %
PLATELETS: 290 10*3/uL (ref 150–400)
RBC: 4.61 MIL/uL (ref 3.87–5.11)
RDW: 16.3 % — AB (ref 11.5–15.5)
WBC: 7.6 10*3/uL (ref 4.0–10.5)

## 2016-05-05 LAB — GLUCOSE, CAPILLARY
Glucose-Capillary: 115 mg/dL — ABNORMAL HIGH (ref 65–99)
Glucose-Capillary: 108 mg/dL — ABNORMAL HIGH (ref 65–99)

## 2016-05-05 MED ORDER — OXYCODONE HCL 5 MG/5ML PO SOLN: 5.0000 mg | ORAL | Status: DC | PRN

## 2016-05-05 MED ORDER — OXYCODONE HCL 5 MG/5ML PO SOLN: 5.0000 mg | ORAL | Status: AC | PRN

## 2016-05-05 NOTE — Progress Notes (Signed)
Patient alert and oriented, pain is controlled. Patient is tolerating fluids, advanced to protein shake yesterday, patient is tolerating well.  Reviewed Gastric sleeve discharge instructions with patient and patient is able to articulate understanding.  Provided information on BELT program, Support Group and [facility] outpatient pharmacy. All questions answered, will continue to monitor.  

## 2016-05-05 NOTE — Discharge Summary (Signed)
Physician Discharge Summary  Gabriela Haney NWG:[id number] DOB: Aug 06, 1971 DOA: 05/03/2016  PCP: [name], NP  Admit date: 05/03/2016 Discharge date: 05/05/2016  Recommendations for Outpatient Follow-up:   Follow-up Information    Follow up with [name], MD. Go on 05/20/2016.   Specialty:  General Surgery   Why:  For Post-Op Check at 3:15   Contact information:   [address] [place] [address] [phone]       Follow up with [name], MD. Go on 06/23/2016.   Specialty:  General Surgery   Why:  For Post-Op Check at 8:45   Contact information:   [address] [place] [address] [phone]      Discharge Diagnoses:  Principal Problem:   Morbid obesity (HCC) Active Problems:   OSA on CPAP   Diabetes mellitus type 2 in obese (HCC)   GERD (gastroesophageal reflux disease)   Chronic lower back pain   Other bilateral secondary osteoarthritis of knee   HTN (hypertension)   Fatty liver   S/P laparoscopic sleeve gastrectomy   Surgical Procedure: Laparoscopic Sleeve Gastrectomy, upper endoscopy  Discharge Condition: Good Disposition: Home  Diet recommendation: Postoperative sleeve gastrectomy diet (liquids only)  Filed Weights   05/03/16 0856 05/04/16 1025  Weight: 127.551 kg (281 lb 3.2 oz) 130.455 kg (287 lb 9.6 oz)     Hospital Course:  The patient was admitted for a planned laparoscopic sleeve gastrectomy. Please see operative note. Preoperatively the patient was given 5000 units of subcutaneous heparin for DVT prophylaxis. Postoperative prophylactic Lovenox dosing was started on the morning of postoperative day 1. On POD 0 in the evening she was doing and was started on ice chips.  On POD 1 her HR was at her baseline without fever and she looked well so she was advanced to POD 2 diet. Her liquid intake was not adequate and safe for discharge on POD 1 so she was kept another night. On postoperative day 2 she looked great.  Was ambulating in halls. Vitals were at her baseline. Had had 1 shake already this morning prior to my rounds. The patient was ambulating without difficulty. Their vital signs are stable without fever. Their hemoglobin had remained stable. The patient was maintained on their home settings for CPAP therapy. The patient had received discharge instructions and counseling. They were deemed stable for discharge.  BP 145/97 mmHg  Pulse 102  Temp(Src) 98 F (36.7 C) (Oral)  Resp 16  Ht  (1.702 m)  Wt 130.455 kg (287 lb 9.6 oz)  BMI 45.03 kg/m2  SpO2 96%  LMP 03/16/2016  Gen: alert, NAD, non-toxic appearing Pupils: equal, no scleral icterus Pulm: Lungs clear to auscultation, symmetric chest rise CV: regular rate and rhythm Abd: soft, approp mild tender, nondistended.  No cellulitis. No incisional hernia Ext: no edema, no calf tenderness Skin: no rash, no jaundice  Discharge Instructions  Discharge Instructions    Ambulate hourly while awake    Complete by:  As directed      Call MD for:  difficulty breathing, headache or visual disturbances    Complete by:  As directed      Call MD for:  persistant dizziness or light-headedness    Complete by:  As directed      Call MD for:  persistant nausea and vomiting    Complete by:  As directed      Call MD for:  redness, tenderness, or signs of infection (pain,  swelling, redness, odor or green/yellow discharge around incision site)    Complete by:  As directed      Call MD for:  severe uncontrolled pain    Complete by:  As directed      Call MD for:  temperature >101 F    Complete by:  As directed      Diet bariatric full liquid    Complete by:  As directed      Discharge instructions    Complete by:  As directed   See bariatric discharge instructions     Incentive spirometry    Complete by:  As directed   Perform hourly while awake            Medication List    STOP taking these medications        HYDROcodone-acetaminophen  5-325 MG tablet  Commonly known as:  NORCO     Oxycodone HCl 10 MG Tabs  Replaced by:  oxyCODONE 5 MG/5ML solution      TAKE these medications        ALPRAZolam 0.5 MG tablet  Commonly known as:  XANAX  Take 0.5 mg by mouth at bedtime as needed for anxiety.     ergocalciferol 50000 units capsule  Commonly known as:  VITAMIN D2  Take 50,000 Units by mouth every Friday.     FISH OIL CONCENTRATE PO  Take 1 capsule by mouth daily.     hydrochlorothiazide 25 MG tablet  Commonly known as:  HYDRODIURIL  Take 25 mg by mouth daily.  Notes to Patient:  Monitor Blood Pressure Daily and keep a log for primary care physician.  Monitor for symptoms of dehydration.  You may need to make changes to your medications with rapid weight loss.        loratadine 10 MG tablet  Commonly known as:  CLARITIN  Take 10 mg by mouth daily.     metFORMIN 500 MG tablet  Commonly known as:  GLUCOPHAGE  Take 500 mg by mouth daily.  Notes to Patient:  Monitor Blood Sugar Frequently and keep a log for primary care physician, you may need to adjust medication dosage with rapid weight loss.        montelukast 10 MG tablet  Commonly known as:  SINGULAIR  Take 10 mg by mouth every morning.     omeprazole 40 MG capsule  Commonly known as:  PRILOSEC  Take 40 mg by mouth daily.     oxyCODONE 5 MG/5ML solution  Commonly known as:  ROXICODONE  Take 5-10 mLs (5-10 mg total) by mouth every 4 (four) hours as needed for moderate pain or severe pain.           Follow-up Information    Follow up with Atilano InaWILSON,[name], MD. Go on 05/20/2016.   Specialty:  General Surgery   Why:  For Post-Op Check at 3:15   Contact information:   [address] [place] [address] [phone]       Follow up with Atilano InaWILSON,[name], MD. Go on 06/23/2016.   Specialty:  General Surgery   Why:  For Post-Op Check at 8:45   Contact information:   [address] [place] [address] [phone]        The  results of significant diagnostics from this hospitalization (including imaging, microbiology, ancillary and laboratory) are listed below for reference.    Significant Diagnostic Studies: No results found.  Labs: Basic Metabolic Panel:  Recent Labs Lab 04/30/16 1330 05/04/16 0336  NA 137 136  K 3.7 3.8  CL 104 101  CO2 24 27  GLUCOSE 99 123*  BUN 10 11  CREATININE 0.86 0.84  CALCIUM 9.4 9.0   Liver Function Tests:  Recent Labs Lab 04/30/16 1330 05/04/16 0336  AST 57* 104*  ALT 81* 128*  ALKPHOS 56 56  BILITOT 0.5 0.5  PROT 8.4* 7.3  ALBUMIN 3.9 3.5    CBC:  Recent Labs Lab 04/30/16 1330 05/03/16 1431 05/04/16 0336 05/04/16 1537 05/05/16 0441  WBC 6.3  --  10.1  --  7.6  NEUTROABS 1.8  --  6.2  --  4.0  HGB 13.0 12.3 11.6* 11.7* 11.4*  HCT 39.8 38.2 37.0 36.9 35.5*  MCV 76.7*  --  77.9*  --  77.0*  PLT 366  --  301  --  290    CBG:  Recent Labs Lab 05/04/16 1138 05/04/16 1701 05/04/16 1948 05/05/16 05/05/16 0403  GLUCAP 128* 128* 108* 126* 115*    Principal Problem:   Morbid obesity (HCC) Active Problems:   OSA on CPAP   Diabetes mellitus type 2 in obese (HCC)   GERD (gastroesophageal reflux disease)   Chronic lower back pain   Other bilateral secondary osteoarthritis of knee   HTN (hypertension)   Fatty liver   S/P laparoscopic sleeve gastrectomy   Time coordinating discharge: 15 minutes  Signed:  [name], MD [facility], [place] [phone] 05/05/2016, 7:44 AM

## 2016-05-05 NOTE — Discharge Instructions (Signed)

## 2016-05-12 ENCOUNTER — Telehealth (HOSPITAL_COMMUNITY): Payer: Self-pay

## 2016-05-12 NOTE — Telephone Encounter (Signed)

## 2016-05-18 ENCOUNTER — Encounter: Payer: Medicare Other | Attending: General Surgery

## 2016-05-18 DIAGNOSIS — E119 Type 2 diabetes mellitus without complications: Secondary | ICD-10-CM | POA: Insufficient documentation

## 2016-05-18 DIAGNOSIS — Z6841 Body Mass Index (BMI) 40.0 and over, adult: Secondary | ICD-10-CM | POA: Insufficient documentation

## 2016-05-19 NOTE — Progress Notes (Signed)
Bariatric Class:  Appt start time: 1530 end time:  1630.  2 Week Post-Operative Nutrition Class  Patient was seen on 05/19/2016 for Post-Operative Nutrition education at the [facility].   Surgery date: 03/16/16 Surgery type: Sleeve gastrectomy Start weight at [facility]: 313 lbs on 02/11/2016 Weight today: 265.2 lbs  Weight change: 41.9 lbs  TANITA  BODY COMP RESULTS  03/01/16 03/19/16   BMI (kg/m^2) N/A 41.5   Fat Mass (lbs)  146.2   Fat Free Mass (lbs)  119.0   Total Body Water (lbs)  88.2   The following the learning objectives were met by the patient during this course:  Identifies Phase 3A (Soft, High Proteins) Dietary Goals and will begin from 2 weeks post-operatively to 2 months post-operatively  Identifies appropriate sources of fluids and proteins   States protein recommendations and appropriate sources post-operatively  Identifies the need for appropriate texture modifications, mastication, and bite sizes when consuming solids  Identifies appropriate multivitamin and calcium sources post-operatively  Describes the need for physical activity post-operatively and will follow MD recommendations  States when to call healthcare provider regarding medication questions or post-operative complications  Handouts given during class include:  Phase 3A: Soft, High Protein Diet Handout  Follow-Up Plan: Patient will follow-up at [facility] in 6 weeks for 2 month post-op nutrition visit for diet advancement per MD.

## 2016-06-29 ENCOUNTER — Ambulatory Visit: Payer: Self-pay | Admitting: Dietician

## 2016-07-14 ENCOUNTER — Encounter: Payer: Medicare Other | Attending: General Surgery | Admitting: Dietician

## 2016-07-14 DIAGNOSIS — Z6841 Body Mass Index (BMI) 40.0 and over, adult: Secondary | ICD-10-CM | POA: Diagnosis present

## 2016-07-14 DIAGNOSIS — E119 Type 2 diabetes mellitus without complications: Secondary | ICD-10-CM | POA: Insufficient documentation

## 2016-07-14 NOTE — Patient Instructions (Addendum)
Goals:  Follow Phase 3B: High Protein + Non-Starchy Vegetables  Eat 3-6 small meals/snacks, every 3-5 hrs  Increase lean protein foods to meet 60g goal  Try adding a protein food in the middle of the day: cheese, Premier shake, deli meat  Increase fluid intake to 64oz +  Get Diet V8 Splash  Avoid drinking 15 minutes before, during and 30 minutes after eating  Aim for >30 min of physical activity daily  Surgery date: 7/10//17 Surgery type: Sleeve gastrectomy Start weight at [facility]: 313 lbs on 02/11/2016 Weight today: 252.2 lbs Weight change: 13 lbs Total weight loss: 60.8 lbs  TANITA  BODY COMP RESULTS  03/01/16 03/19/16 07/14/16   BMI (kg/m^2) N/A 41.5 39.5   Fat Mass (lbs)  146.2 130.4   Fat Free Mass (lbs)  119.0 121.8   Total Body Water (lbs)  88.2 89.6

## 2016-07-14 NOTE — Progress Notes (Signed)
  Follow-up visit:  8 Weeks Post-Operative Sleeve gastrectomy Surgery  Medical Nutrition Therapy:  Appt start time: 255 end time:  330  Primary concerns today: Post-operative Bariatric Surgery Nutrition Management. Gabriela BienenstockKisha returns having lost another 13 pounds since last visit. She reports feeling great about her weight loss! Able to cross her legs, bathe more easily, and has more stamina.  She states pasta does not sit well. Lettuce and other greens have also been difficult to tolerate.   Surgery date: 7/10//17 Surgery type: Sleeve gastrectomy [facility]: [facility] lbs on 02/11/2016 Weight today: 252.2 lbs Weight change: 13 lbs Total weight loss: 60.8 lbs  TANITA  BODY COMP RESULTS  03/01/16 03/19/16 07/14/16   BMI (kg/m^2) N/A 41.5 39.5   Fat Mass (lbs)  146.2 130.4   Fat Free Mass (lbs)  119.0 121.8   Total Body Water (lbs)  88.2 89.6    Preferred Learning Style:   No preference indicated   Learning Readiness:   Ready  24-hr recall:  Wakes up around 8-9am  B (11AM): Premier shake OR egg and [place] sausage/bacon (10-30g) Snk (AM):   L (PM):  Snk (PM):   D (5:30 PM): 3 oz baked chicken or pork chops with vegetables (21g) Snk (PM):   Goes to bed around 10-11pm  Fluid intake: V8 splash, decaf coffee, water ("close to 64 oz per day") Estimated total protein intake: 30-50 grams per day  Medications: see list Supplementation: taking  CBG monitoring: does not test Last patient reported A1c: unknown  Using straws: no Drinking while eating: no Hair loss: none Carbonated beverages: none N/V/D/C: vomits sometimes when she overeats Dumping syndrome: none  Recent physical activity:  None, has difficulty finding time  Progress Towards Goal(s):  In progress.  Handouts given during visit include:  Phase 3B lean protein + non starchy vegetables   Nutritional Diagnosis:  [place]-3.3 Overweight/obesity related to past poor dietary habits and physical inactivity as  evidenced by patient w/ recent sleeve gastrectomy surgery following dietary guidelines for continued weight loss.     Intervention:  Nutrition counseling provided.  Teaching Method Utilized:  Visual Auditory Hands on  Barriers to learning/adherence to lifestyle change: none  Demonstrated degree of understanding via:  Teach Back   Monitoring/Evaluation:  Dietary intake, exercise, and body weight. Follow up in 3 months for 5 month post-op visit.

## 2016-10-13 ENCOUNTER — Ambulatory Visit: Payer: Self-pay | Admitting: Dietician

## 2016-10-29 ENCOUNTER — Ambulatory Visit: Payer: Self-pay | Admitting: Skilled Nursing Facility1

## 2017-01-22 IMAGING — RF DG UGI W/ KUB
10 series · 11 of 11 positions shown · non-contrast
Comparison: None.

CLINICAL DATA: Preop sleeve gastrectomy

EXAM:
UPPER GI SERIES WITH KUB
TECHNIQUE: After obtaining a scout radiograph a routine upper GI series was
performed using thin barium
FLUOROSCOPY TIME:  Radiation Exposure Index (as provided by the
fluoroscopic device): 68.4 mGy

[Series 1: t abdomen supine · 0.15mm/px · 1 of 1 slices shown]
[im 1/1]
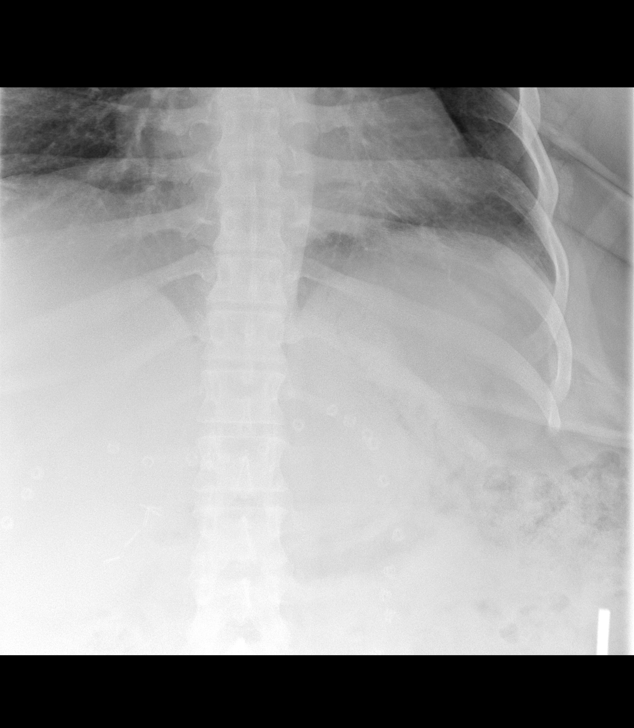

[Series 2: fluoro_barium 2fps_bw · 0.17mm/px · 2 of 2 frames shown (1 of 9)]
[frame 1/2]
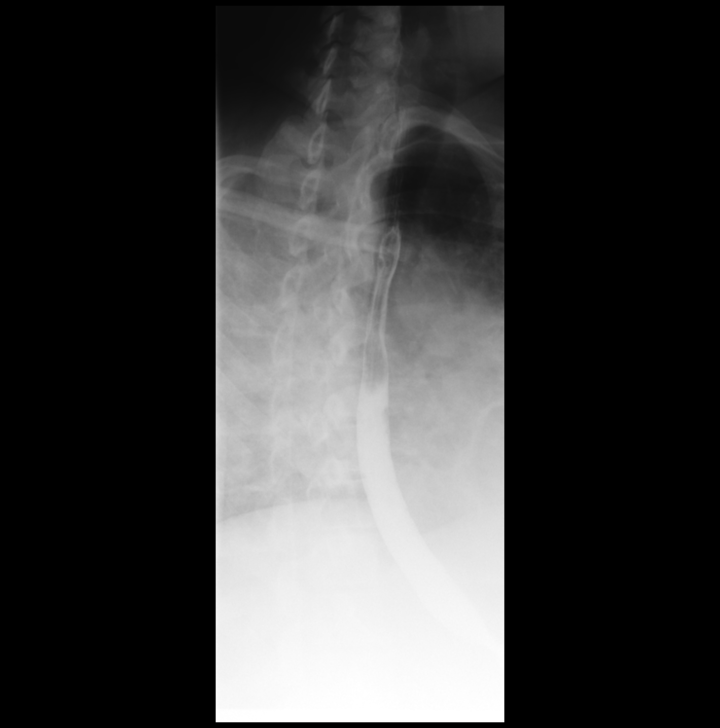
[frame 2/2]
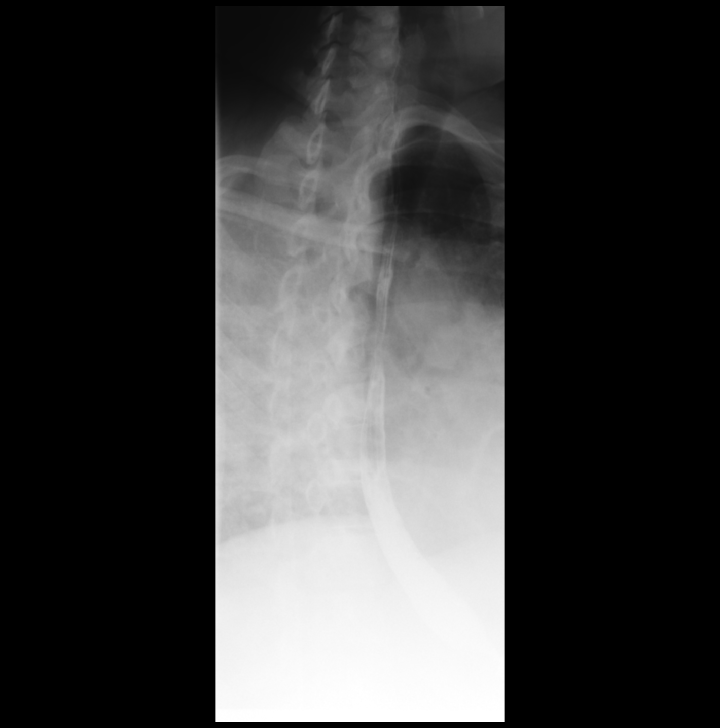

[Series 3: fluoro_barium 2fps_bw · 0.17mm/px · 1 of 1 slices shown (2 of 9)]
[im 1/1]
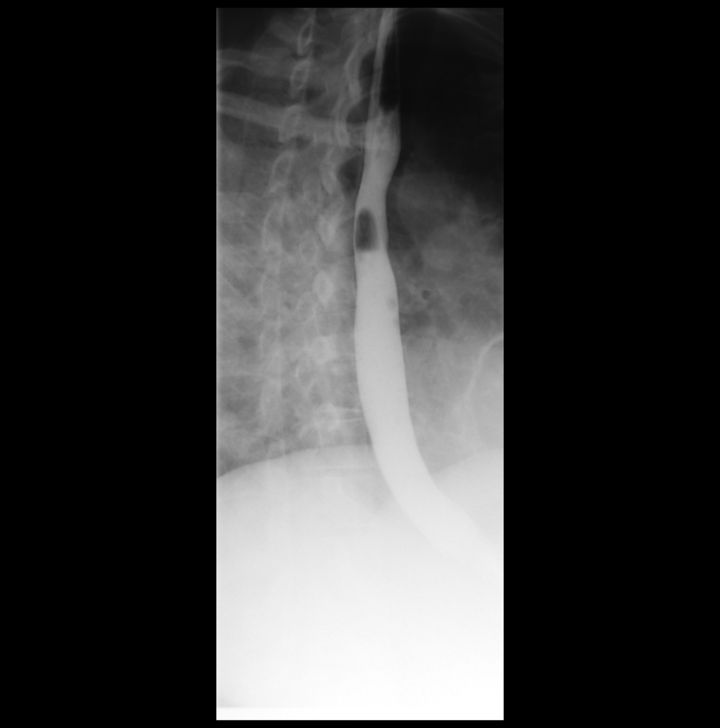

[Series 4: fluoro_barium 2fps_bw · 0.17mm/px · 1 of 1 slices shown (3 of 9)]
[im 1/1]
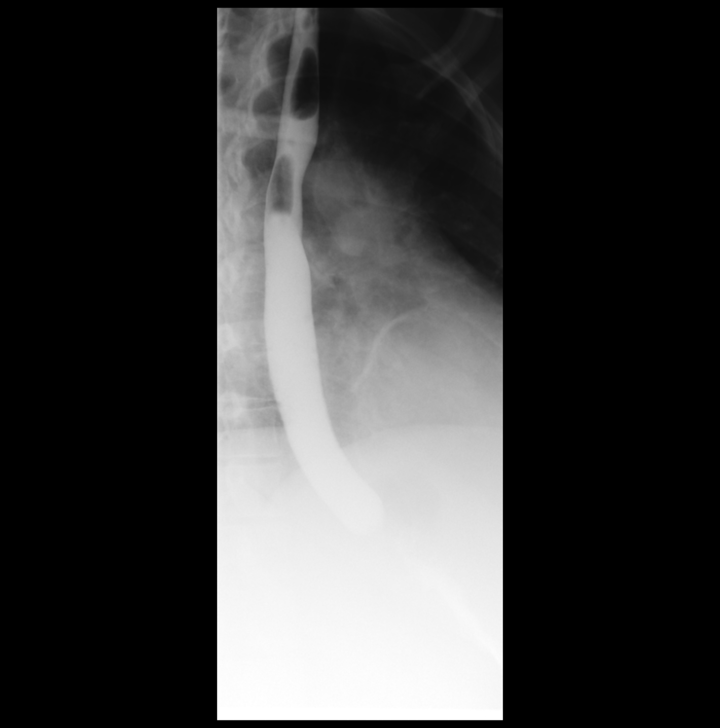

[Series 5: fluoro_barium 2fps_bw · 0.17mm/px · 1 of 1 slices shown (4 of 9)]
[im 1/1]
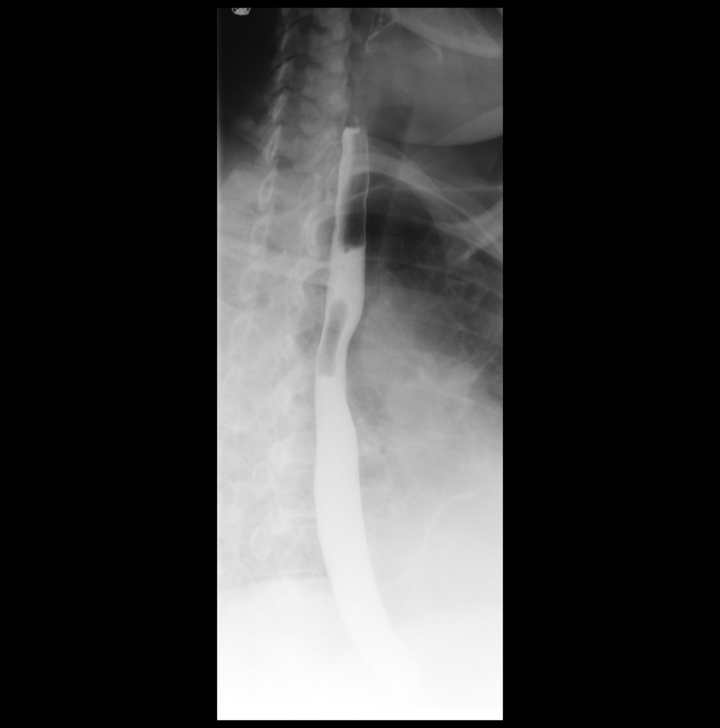

[Series 6: fluoro_barium 2fps_bw · 0.17mm/px · 1 of 1 slices shown (5 of 9)]
[im 1/1]
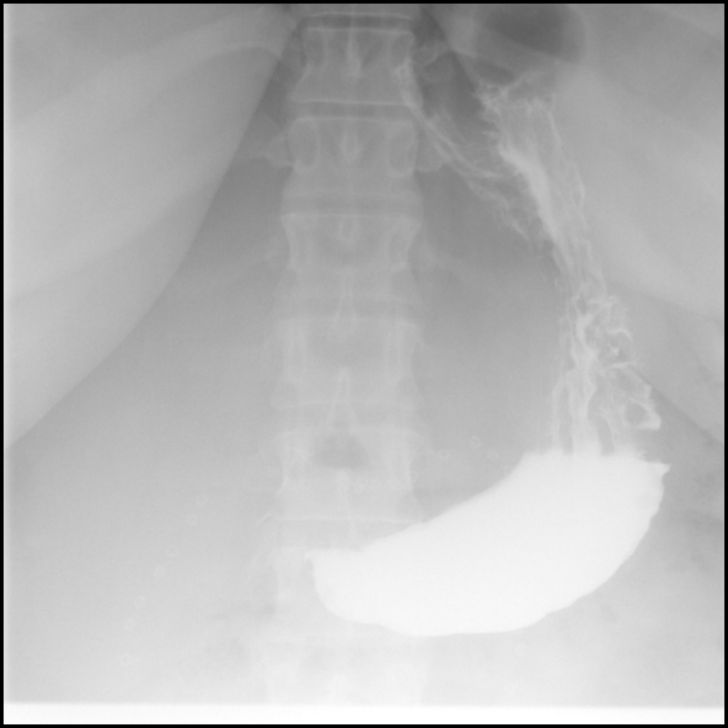

[Series 7: fluoro_barium 2fps_bw · 0.17mm/px · 1 of 1 slices shown (6 of 9)]
[im 1/1]
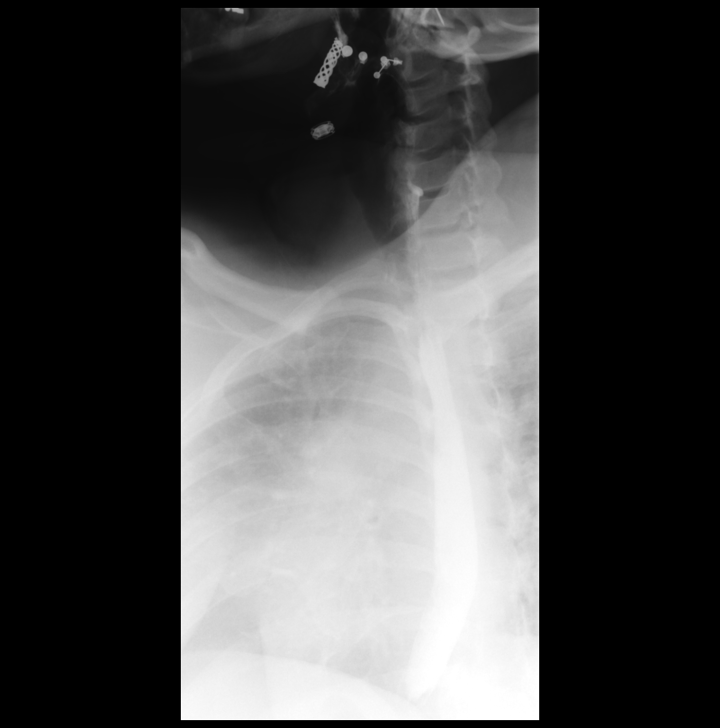

[Series 8: fluoro_barium 2fps_bw · 0.18mm/px · 1 of 1 slices shown (7 of 9)]
[im 1/1]
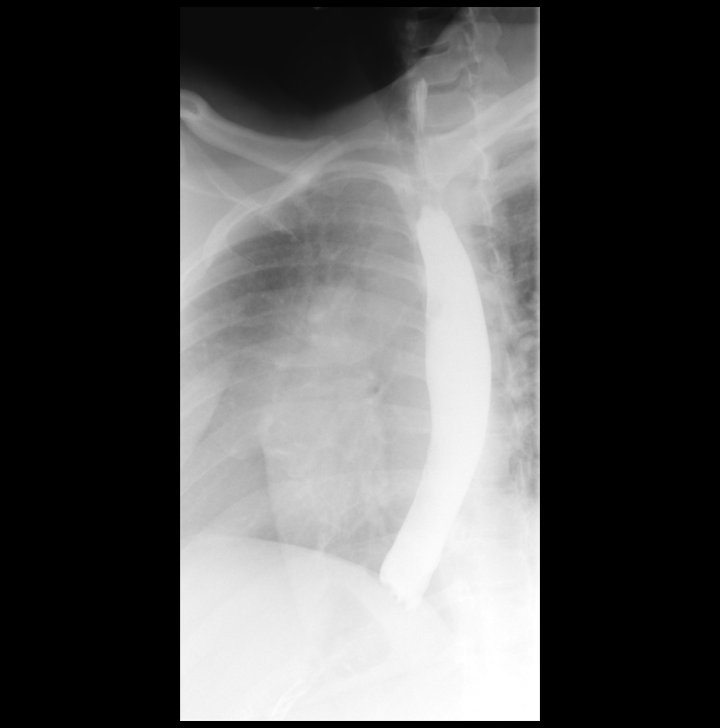

[Series 9: fluoro_barium 2fps_bw · 0.18mm/px · 1 of 1 slices shown (8 of 9)]
[im 1/1]
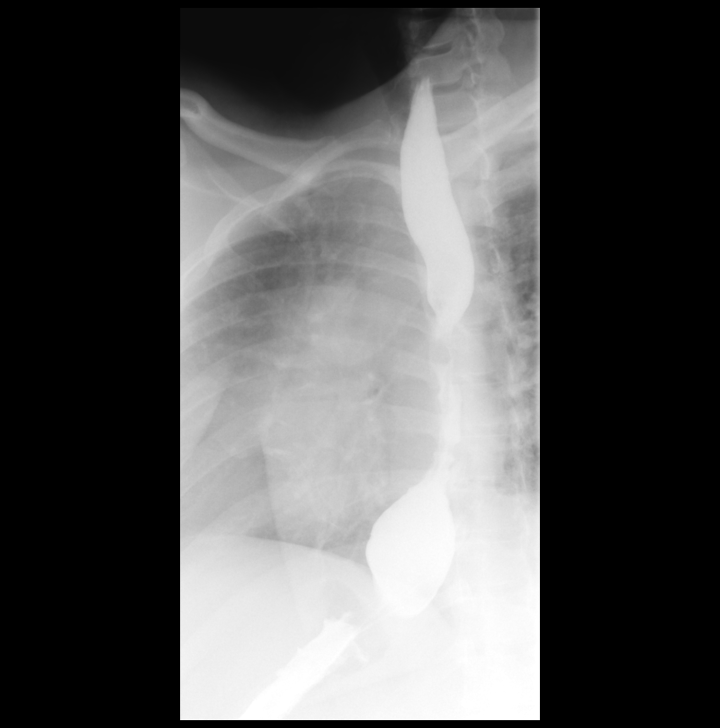

[Series 10: fluoro_barium 2fps_bw · 0.18mm/px · 1 of 1 slices shown (9 of 9)]
[im 1/1]
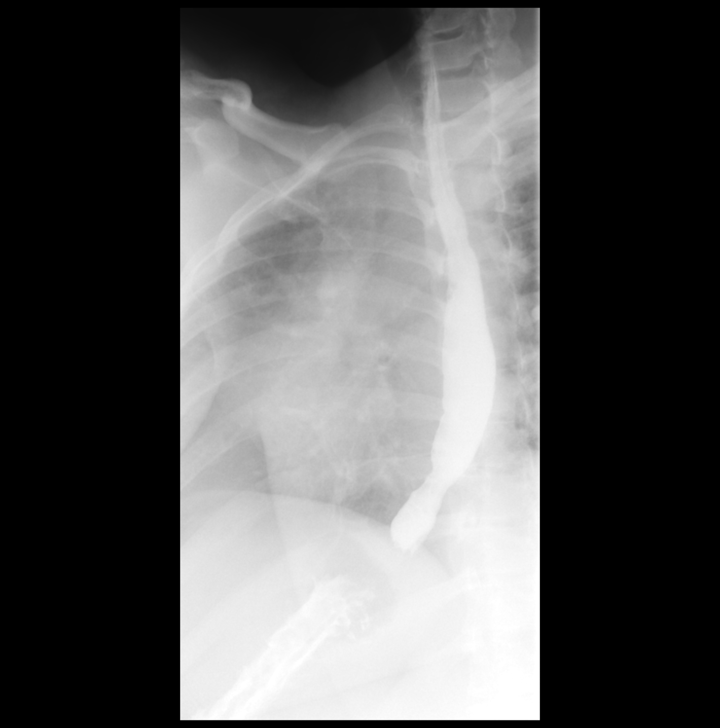

[11 of 11 positions shown; findings below may reference images not displayed]

FINDINGS: Scout radiograph is unremarkable.

Normal esophageal peristalsis. No fixed esophageal narrowing or
stricture.

No evidence of hiatal hernia.

Very mild gastroesophageal reflux in the prone position.

Normal gastric folds.
IMPRESSION: Very mild gastroesophageal reflux.

Otherwise negative single-contrast upper GI.

## 2017-12-08 ENCOUNTER — Encounter (HOSPITAL_COMMUNITY): Payer: Self-pay
